# Patient Record
Sex: Male | Born: 1960 | Race: White | Hispanic: No | Marital: Married | Smoking: Former smoker
Health system: Southern US, Community
[De-identification: ages and names within clinical notes are randomized; demographics above are authoritative.]

## PROBLEM LIST (undated history)

## (undated) DIAGNOSIS — E119 Type 2 diabetes mellitus without complications: Secondary | ICD-10-CM

## (undated) DIAGNOSIS — I219 Acute myocardial infarction, unspecified: Secondary | ICD-10-CM

## (undated) DIAGNOSIS — I10 Essential (primary) hypertension: Secondary | ICD-10-CM

## (undated) DIAGNOSIS — E7800 Pure hypercholesterolemia, unspecified: Secondary | ICD-10-CM

## (undated) HISTORY — PX: HERNIA REPAIR: SHX51

---

## 2017-01-08 ENCOUNTER — Encounter: Payer: Self-pay | Admitting: *Deleted

## 2017-01-08 ENCOUNTER — Emergency Department (INDEPENDENT_AMBULATORY_CARE_PROVIDER_SITE_OTHER): Payer: Self-pay

## 2017-01-08 ENCOUNTER — Emergency Department
Admission: EM | Admit: 2017-01-08 | Discharge: 2017-01-08 | Disposition: A | Discharge status: Home / Self Care | Payer: Self-pay | Source: Home / Self Care | Attending: Family Medicine | Admitting: Family Medicine

## 2017-01-08 DIAGNOSIS — J209 Acute bronchitis, unspecified: Secondary | ICD-10-CM

## 2017-01-08 DIAGNOSIS — R05 Cough: Secondary | ICD-10-CM

## 2017-01-08 DIAGNOSIS — R062 Wheezing: Secondary | ICD-10-CM

## 2017-01-08 HISTORY — DX: Type 2 diabetes mellitus without complications: E11.9

## 2017-01-08 MED ORDER — AZITHROMYCIN 250 MG PO TABS: ORAL_TABLET | ORAL | 0 refills | Status: DC

## 2017-01-08 NOTE — ED Provider Notes (Signed)
[name]    CSN: [id number] Arrival date & time: 01/08/17  1437     History   Chief Complaint Chief Complaint  Patient presents with  . Cough    HPI Travis Roach is a 56 y.o. male.   About 2.5 weeks ago patient developed typical cold-like symptoms developing over several days, including mild sore throat, sinus congestion, headache, fatigue, and cough.  He now complains of persistent cough, without shortness of breath or pleuritic pain.  No fevers, chills, and sweats.   The history is provided by the patient.    Past Medical History:  Diagnosis Date  . Diabetes mellitus without complication (HCC)     There are no active problems to display for this patient.   Past Surgical History:  Procedure Laterality Date  . HERNIA REPAIR         Home Medications    Prior to Admission medications   Medication Sig Start Date End Date Taking? Authorizing Provider  glimepiride (AMARYL) 4 MG tablet Take 4 mg by mouth daily with breakfast.   Yes Historical Provider, MD  metFORMIN (GLUCOPHAGE) 1000 MG tablet Take 1,000 mg by mouth 2 (two) times daily with a meal.   Yes Historical Provider, MD  azithromycin (ZITHROMAX Z-PAK) 250 MG tablet Take 2 tabs today; then begin one tab once daily for 4 more days. 01/08/17   Lattie Haw, MD    Family History Family History  Problem Relation Age of Onset  . Cancer Mother     breast and stomach  . Cancer Father     colon and prostate    Social History Social History  Substance Use Topics  . Smoking status: Former Games developer  . Smokeless tobacco: Never Used  . Alcohol use No     Allergies   Prednisone   Review of Systems Review of Systems + sore throat + cough No pleuritic pain No wheezing + nasal congestion + post-nasal drainage No sinus pain/pressure No itchy/red eyes No earache No hemoptysis No SOB No fever/chills No nausea No vomiting No abdominal pain No diarrhea No urinary symptoms No skin  rash + fatigue No myalgias No headache Used OTC meds without relief   Physical Exam Triage Vital Signs ED Triage Vitals  Enc Vitals Group     BP 01/08/17 1459 101/66     Pulse Rate 01/08/17 1459 87     Resp 01/08/17 1459 18     Temp 01/08/17 1459 97.8 F (36.6 C)     Temp Source 01/08/17 1459 Oral     SpO2 01/08/17 1459 97 %     Weight 01/08/17 1500 210 lb (95.3 kg)     Height 01/08/17 1500  (1.753 m)     Head Circumference --      Peak Flow --      Pain Score 01/08/17 1500 0     Pain Loc --      Pain Edu? --      Excl. in GC? --    No data found.   Updated Vital Signs BP 101/66 (BP Location: Left Arm)   Pulse 87   Temp 97.8 F (36.6 C) (Oral)   Resp 18   Ht  (1.753 m)   Wt 210 lb (95.3 kg)   SpO2 97%   BMI 31.01 kg/m   Visual Acuity Right Eye Distance:   Left Eye Distance:   Bilateral Distance:    Right Eye Near:   Left Eye Near:  Bilateral Near:     Physical Exam Nursing notes and Vital Signs reviewed. Appearance:  Patient appears stated age, and in no acute distress Eyes:  Pupils are equal, round, and reactive to light and accomodation.  Extraocular movement is intact.  Conjunctivae are not inflamed  Ears:  Canals normal.  Tympanic membranes normal.  Nose:  Mildly congested turbinates.  No sinus tenderness.   Pharynx:  Normal Neck:  Supple.  Tender enlarged posterior/lateral nodes are palpated bilaterally  Lungs:  Clear to auscultation.  Breath sounds are equal.  Moving air well. Heart:  Regular rate and rhythm without murmurs, rubs, or gallops.  Abdomen:  Nontender without masses or hepatosplenomegaly.  Bowel sounds are present.  No CVA or flank tenderness.  Extremities:  No edema.  Skin:  No rash present.    UC Treatments / Results  Labs (all labs ordered are listed, but only abnormal results are displayed) Labs Reviewed - No data to display  EKG  EKG Interpretation None       Radiology Dg Chest 2 View  Result Date:  01/08/2017 CLINICAL DATA:  Chest congestion, cough, wheezing for 2 weeks EXAM: CHEST  2 VIEW COMPARISON:  None. FINDINGS: No pneumonia or effusion is seen. Mediastinal and hilar contours are unremarkable. There is peribronchial thickening which may indicate bronchitis. The heart is within normal limits in size. No bony abnormality is seen. IMPRESSION: No pneumonia or effusion.  Question bronchitis. Electronically Signed   By: Dwyane Dee M.D.   On: 01/08/2017 16:21    Procedures Procedures (including critical care time)  Medications Ordered in UC Medications - No data to display   Initial Impression / Assessment and Plan / UC Course  I have reviewed the triage vital signs and the nursing notes.  Pertinent labs & imaging results that were available during my care of the patient were reviewed by me and considered in my medical decision making (see chart for details).    Begin Z-pak for atypical coverage. Take plain guaifenesin (  extended release tabs such as Mucinex) twice daily, with plenty of water, for cough and congestion.  Get adequate rest.   May take Delsym Cough Suppressant at bedtime for nighttime cough.  Stop all antihistamines for now, and other non-prescription cough/cold preparations. Follow-up with family doctor if not improving about 7 to 10 days.    Final Clinical Impressions(s) / UC Diagnoses   Final diagnoses:  Acute bronchitis, unspecified organism    New Prescriptions New Prescriptions   AZITHROMYCIN (ZITHROMAX Z-PAK) 250 MG TABLET    Take 2 tabs today; then begin one tab once daily for 4 more days.     Lattie Haw, MD 01/13/17 [phone]

## 2017-01-08 NOTE — Discharge Instructions (Signed)
Take plain guaifenesin (1200mg extended release tabs such as Mucinex) twice daily, with plenty of water, for cough and congestion.  Get adequate rest.   °May take Delsym Cough Suppressant at bedtime for nighttime cough.  °Stop all antihistamines for now, and other non-prescription cough/cold preparations. ° °Follow-up with family doctor if not improving about 7 to10 days.  °

## 2017-01-08 NOTE — ED Triage Notes (Signed)
Pt c/o cough and chest congestion x 2 wks; the cough is mostly nonproductive. Denies fever. He has taken claritin and mucinex.

## 2017-05-07 ENCOUNTER — Encounter: Payer: Self-pay | Admitting: Osteopathic Medicine

## 2017-05-07 ENCOUNTER — Ambulatory Visit (INDEPENDENT_AMBULATORY_CARE_PROVIDER_SITE_OTHER): Payer: Self-pay | Admitting: Osteopathic Medicine

## 2017-05-07 VITALS — BP 145/90 | HR 82 | Ht 66.0 in | Wt 221.0 lb

## 2017-05-07 DIAGNOSIS — IMO0001 Reserved for inherently not codable concepts without codable children: Secondary | ICD-10-CM

## 2017-05-07 DIAGNOSIS — I10 Essential (primary) hypertension: Secondary | ICD-10-CM | POA: Insufficient documentation

## 2017-05-07 DIAGNOSIS — E1165 Type 2 diabetes mellitus with hyperglycemia: Secondary | ICD-10-CM

## 2017-05-07 LAB — POCT GLYCOSYLATED HEMOGLOBIN (HGB A1C): Hemoglobin A1C: 8.2

## 2017-05-07 MED ORDER — LOSARTAN POTASSIUM 25 MG PO TABS: 12.5000 mg | ORAL_TABLET | Freq: Every day | ORAL | 0 refills | Status: DC

## 2017-05-07 MED ORDER — GLIMEPIRIDE 4 MG PO TABS: 4.0000 mg | ORAL_TABLET | Freq: Two times a day (BID) | ORAL | 1 refills | Status: DC

## 2017-05-07 MED ORDER — METFORMIN HCL 1000 MG PO TABS: 1000.0000 mg | ORAL_TABLET | Freq: Two times a day (BID) | ORAL | 3 refills | Status: DC

## 2017-05-07 NOTE — Patient Instructions (Signed)
Blood pressure goal: 120/80 is considered normal, 130/80 or less is a good goal for you  Diabetes: Metformin twice per day every day Glimepiride as you've been taking it  Labs today  Recheck A1C in 3 months here in the office   Any problems or questions, please let me know!

## 2017-05-07 NOTE — Progress Notes (Signed)
HPI: Travis Roach is a 56 y.o. male  who [facility], 05/07/17,  for chief complaint of:  Chief Complaint  Patient presents with  . Establish Care    DM2: No follow-up in some time. Last A1C through [facility] was 7.2 in 2016. He has been taking medications "as needed" for high blood sugar. He is measuring fasting blood sugar most days, has been trying to target into the low 100s.  HTN:  BP high today. No meds. 131/90 in 04/2016. Has never been on medications for blood pressure. No chest pain, pressure, shortness of breath.   Past medical, surgical, social and family history reviewed: Patient Active Problem List   Diagnosis Date Noted  . Uncontrolled type 2 diabetes mellitus without complication, without long-term current use of insulin (HCC) 05/07/2017  . Essential hypertension 05/07/2017   Past Surgical History:  Procedure Laterality Date  . HERNIA REPAIR     Social History  Substance Use Topics  . Smoking status: Former Games developermoker  . Smokeless tobacco: Never Used  . Alcohol use No   Family History  Problem Relation Age of Onset  . Cancer Mother        breast and stomach  . Cancer Father        colon and prostate     Current medication list and allergy/intolerance information reviewed:  Out of meds, previous on Glimepiride 4 mg bid and Metformin 1000 mg bid   Allergies  Allergen Reactions  . Prednisone       Review of Systems:  Constitutional:  No  fever, no chills, No recent illness, No unintentional weight changes. No significant fatigue.   HEENT: No  headache, no vision change, no hearing change, No sore throat, No  sinus pressure  Cardiac: No  chest pain, No  pressure, No palpitations, No  Orthopnea  Respiratory:  No  shortness of breath. No  Cough  Gastrointestinal: No  abdominal pain, No  nausea, No  vomiting,  No  blood in stool, No  diarrhea, No  constipation   Musculoskeletal: No new  myalgia/arthralgia  Genitourinary: No  incontinence, No  abnormal genital bleeding, No abnormal genital discharge  Skin: No  Rash, No other wounds/concerning lesions  Hem/Onc: No  easy bruising/bleeding, No  abnormal lymph node  Endocrine: No cold intolerance,  No heat intolerance. No polyuria/polydipsia/polyphagia   Neurologic: No  weakness, No  dizziness, No  slurred speech/focal weakness/facial droop  Psychiatric: No  concerns with depression, No  concerns with anxiety, No sleep problems, No mood problems  Exam:  BP (!) 145/90   Pulse 82   Ht 5\' 6"  (1.676 m)   Wt 221 lb (100.2 kg)   BMI 35.67 kg/m  on initial intake was 183/102  Constitutional: VS see above. General Appearance: alert, well-developed, well-nourished, NAD  Eyes: Normal lids and conjunctive, non-icteric sclera  Ears, Nose, Mouth, Throat: MMM, Normal external inspection ears/nares/mouth/lips/gums.  Neck: No masses, trachea midline  Respiratory: Normal respiratory effort. no wheeze, no rhonchi, no rales  Cardiovascular: S1/S2 normal, no murmur, no rub/gallop auscultated. RRR. No lower extremity edema.  Musculoskeletal: Gait normal.   Neurological: Normal balance/coordination.   Psychiatric: Normal judgment/insight. Normal mood and affect. Oriented x3.    Results for orders placed or performed in visit on 05/07/17 (from the past 72 hour(s))  POCT HgB A1C     Status: Abnormal   Collection Time: 05/07/17  2:35 PM  Result Value Ref Range   Hemoglobin  A1C 8.2      ASSESSMENT/PLAN:   Uncontrolled type 2 diabetes mellitus without complication, without long-term current use of insulin (HCC) - Plan: POCT HgB A1C, metFORMIN (GLUCOPHAGE) 1000 MG tablet, COMPLETE METABOLIC PANEL WITH GFR, Lipid panel, glimepiride (AMARYL) 4 MG tablet  Essential hypertension - Plan: losartan (COZAAR) 25 MG tablet    Patient Instructions  Blood pressure goal: 120/80 is considered normal, 130/80 or less is a good goal for  you  Diabetes: Metformin twice per day every day Glimepiride as you've been taking it  Labs today  Recheck A1C in 3 months here in the office   Any problems or questions, please let me know!      Visit summary with medication list and pertinent instructions was printed for patient to review. All questions at time of visit were answered - patient instructed to contact office with any additional concerns. ER/RTC precautions were reviewed with the patient. Follow-up plan: Return in about 3 months (around 08/07/2017) for recheck blood sugars .  Note: Total time spent 30 minutes, greater than 50% of the visit was spent face-to-face counseling and coordinating care for the following: The primary encounter diagnosis was Uncontrolled type 2 diabetes mellitus without complication, without long-term current use of insulin (HCC). A diagnosis of Essential hypertension was also pertinent to this visit..[name] Kitchen

## 2017-05-08 LAB — LIPID PANEL
Cholesterol: 175 mg/dL (ref <200)
HDL: 51 mg/dL (ref >40)
LDL CALC: 89 mg/dL (ref <100)
Total CHOL/HDL Ratio: 3.4 Ratio (ref <5.0)
Triglycerides: 177 mg/dL — ABNORMAL HIGH (ref <150)
VLDL: 35 mg/dL — ABNORMAL HIGH (ref <30)

## 2017-05-08 LAB — COMPLETE METABOLIC PANEL WITH GFR
ALBUMIN: 4.2 g/dL (ref 3.6–5.1)
ALK PHOS: 65 U/L (ref 40–115)
ALT: 17 U/L (ref 9–46)
AST: 14 U/L (ref 10–35)
BILIRUBIN TOTAL: 0.6 mg/dL (ref 0.2–1.2)
BUN: 15 mg/dL (ref 7–25)
CO2: 22 mmol/L (ref 20–31)
Calcium: 9.1 mg/dL (ref 8.6–10.3)
Chloride: 103 mmol/L (ref 98–110)
Creat: 0.93 mg/dL (ref 0.70–1.33)
GFR, Est African American: >89 mL/min (ref >=60)
GFR, Est Non African American: >89 mL/min (ref >=60)
GLUCOSE: 178 mg/dL — AB (ref 65–99)
Potassium: 4.2 mmol/L (ref 3.5–5.3)
SODIUM: 137 mmol/L (ref 135–146)
TOTAL PROTEIN: 6.4 g/dL (ref 6.1–8.1)

## 2017-08-07 ENCOUNTER — Ambulatory Visit (INDEPENDENT_AMBULATORY_CARE_PROVIDER_SITE_OTHER): Payer: Self-pay | Admitting: Osteopathic Medicine

## 2017-08-07 ENCOUNTER — Encounter: Payer: Self-pay | Admitting: Osteopathic Medicine

## 2017-08-07 VITALS — BP 117/74 | HR 93 | Ht 66.0 in | Wt 210.0 lb

## 2017-08-07 DIAGNOSIS — E1165 Type 2 diabetes mellitus with hyperglycemia: Secondary | ICD-10-CM

## 2017-08-07 DIAGNOSIS — IMO0001 Reserved for inherently not codable concepts without codable children: Secondary | ICD-10-CM

## 2017-08-07 LAB — POCT GLYCOSYLATED HEMOGLOBIN (HGB A1C): HEMOGLOBIN A1C: 8.8

## 2017-08-07 NOTE — Patient Instructions (Signed)
Plan: Continue current medications - to twice per day  Hold off for now, but think about statin medication for heart protection Labs before next visit in 3-4 months

## 2017-08-07 NOTE — Progress Notes (Signed)
HPI: Travis Roach is a 56 y.o. male  who [facility] today, 08/07/17,  for chief complaint of:  Chief Complaint  Patient presents with  . Diabetes    DM2: Last A1C through [facility] was 7.2 in 2016. He had been taking medications "as needed" for high blood sugar, prior to establishing here 3 months ago, at which point A1C was 8.2. He is measuring fasting blood sugar most days, has been trying to target into the low 100s. At last visit, 05/07/17, restarted metformin and glimepiride. A1C now 8.8 today. He is not taking the meds bid   HTN:  BP high last visit 145/90 on no meds. 131/90 in 04/2016. Has never been on medications for blood pressure. No chest pain, pressure, shortness of breath. We started Losartan last visit but he isn't taking this. BP looking ok today.    Past medical, surgical, social and family history reviewed: Patient Active Problem List   Diagnosis Date Noted  . Uncontrolled type 2 diabetes mellitus without complication, without long-term current use of insulin (HCC) 05/07/2017  . Essential hypertension 05/07/2017   Past Surgical History:  Procedure Laterality Date  . HERNIA REPAIR     Social History  Substance Use Topics  . Smoking status: Former Games developermoker  . Smokeless tobacco: Never Used  . Alcohol use No   Family History  Problem Relation Age of Onset  . Cancer Mother        breast and stomach  . Cancer Father        colon and prostate     Current medication list and allergy/intolerance information reviewed:  Out of meds, previous on Glimepiride 4 mg bid and Metformin 1000 mg bid   Allergies  Allergen Reactions  . Prednisone       Review of Systems:  Constitutional:  No  fever, no chills, No recent illness  Cardiac: No  chest pain, No  pressure, No palpitations  Respiratory:  No  shortness of breath. No  Cough  Gastrointestinal: No  abdominal pain, No  nausea, No  vomiting  Musculoskeletal: No  new myalgia/arthralgia   Exam:  BP 117/74   Pulse 93   Ht 5\' 6"  (1.676 m)   Wt 210 lb (95.3 kg)   BMI 33.89 kg/m    Constitutional: VS see above. General Appearance: alert, well-developed, well-nourished, NAD  Eyes: Normal lids and conjunctive, non-icteric sclera  Ears, Nose, Mouth, Throat: MMM, Normal external inspection ears/nares/mouth/lips/gums.  Neck: No masses, trachea midline  Respiratory: Normal respiratory effort. no wheeze, no rhonchi, no rales  Cardiovascular: S1/S2 normal, no murmur, no rub/gallop auscultated. RRR. No lower extremity edema.  Musculoskeletal: Gait normal.   Neurological: Normal balance/coordination.   Psychiatric: Normal judgment/insight. Normal mood and affect. Oriented x3.    Results for orders placed or performed in visit on 08/07/17 (from the past 72 hour(s))  POCT HgB A1C     Status: None   Collection Time: 08/07/17  2:04 PM  Result Value Ref Range   Hemoglobin A1C 8.8      ASSESSMENT/PLAN:   Uncontrolled type 2 diabetes mellitus without complication, without long-term current use of insulin (HCC) - Plan: POCT HgB A1C, Lipid panel, COMPLETE METABOLIC PANEL WITH GFR    Patient Instructions  Plan: Continue current medications - to twice per day  Hold off for now, but think about statin medication for heart protection Labs before next visit in 3-4 months   Consider statin and ACE - pt  reluctant to start new meds   Visit summary with medication list and pertinent instructions was printed for patient to review. All questions at time of visit were answered - patient instructed to contact office with any additional concerns. ER/RTC precautions were reviewed with the patient. Follow-up plan: Return in about 4 months (around 12/05/2017) for follow up diabetes and cholesterol, sooner if needed .  Note: Total time spent 30 minutes, greater than 50% of the visit was spent face-to-face counseling and coordinating care for the following: The  encounter diagnosis was Uncontrolled type 2 diabetes mellitus without complication, without long-term current use of insulin (HCC).[name] Kitchen

## 2017-12-08 ENCOUNTER — Ambulatory Visit: Payer: Self-pay | Admitting: Osteopathic Medicine

## 2018-06-01 ENCOUNTER — Encounter: Payer: Self-pay | Admitting: Family Medicine

## 2018-06-01 ENCOUNTER — Ambulatory Visit (INDEPENDENT_AMBULATORY_CARE_PROVIDER_SITE_OTHER): Payer: Self-pay | Admitting: Family Medicine

## 2018-06-01 VITALS — BP 139/75 | HR 79 | Ht 69.0 in | Wt 214.0 lb

## 2018-06-01 DIAGNOSIS — S93491A Sprain of other ligament of right ankle, initial encounter: Secondary | ICD-10-CM

## 2018-06-01 NOTE — Patient Instructions (Addendum)
Thank you for coming in today. Continue the brace and ice and rest as needed.   Do the exercixes listed below.   Recheck as needed.   Recheck with Dr Lyn Hollingshead for follow up diabetes.    Ankle Sprain, Phase I Rehab Ask your health care provider which exercises are safe for you. Do exercises exactly as told by your health care provider and adjust them as directed. It is normal to feel mild stretching, pulling, tightness, or discomfort as you do these exercises, but you should stop right away if you feel sudden pain or your pain gets worse.Do not begin these exercises until told by your health care provider. Stretching and range of motion exercises These exercises warm up your muscles and joints and improve the movement and flexibility of your lower leg and ankle. These exercises also help to relieve pain and stiffness. Exercise A: Gastroc and soleus stretch  1. Sit on the floor with your left / right leg extended. 2. Loop a belt or towel around the ball of your left / right foot. The ball of your foot is on the walking surface, right under your toes. 3. Keep your left / right ankle and foot relaxed and keep your knee straight while you use the belt or towel to pull your foot toward you. You should feel a gentle stretch behind your calf or knee. 4. Hold this position for __________ seconds, then release to the starting position. Repeat the exercise with your knee bent. You can put a pillow or a rolled bath towel under your knee to support it. You should feel a stretch deep in your calf or at your Achilles tendon. Repeat each stretch __________ times. Complete these stretches __________ times a day. Exercise B: Ankle alphabet  1. Sit with your left / right leg supported at the lower leg. ? Do not rest your foot on anything. ? Make sure your foot has room to move freely. 2. Think of your left / right foot as a paintbrush, and move your foot to trace each letter of the alphabet in the air.  Keep your hip and knee still while you trace. Make the letters as large as you can without feeling discomfort. 3. Trace every letter from A to Z. Repeat __________ times. Complete this exercise __________ times a day. Strengthening exercises These exercises build strength and endurance in your ankle and lower leg. Endurance is the ability to use your muscles for a long time, even after they get tired. Exercise C: Dorsiflexors  1. Secure a rubber exercise band or tube to an object, such as a table leg, that will stay still when the band is pulled. Secure the other end around your left / right foot. 2. Sit on the floor facing the object, with your left / right leg extended. The band or tube should be slightly tense when your foot is relaxed. 3. Slowly bring your foot toward you, pulling the band tighter. 4. Hold this position for __________ seconds. 5. Slowly return your foot to the starting position. Repeat __________ times. Complete this exercise __________ times a day. Exercise D: Plantar flexors  1. Sit on the floor with your left / right leg extended. 2. Loop a rubber exercise tube or band around the ball of your left / right foot. The ball of your foot is on the walking surface, right under your toes. ? Hold the ends of the band or tube in your hands. ? The band or tube should be  slightly tense when your foot is relaxed. 3. Slowly point your foot and toes downward, pushing them away from you. 4. Hold this position for __________ seconds. 5. Slowly return your foot to the starting position. Repeat __________ times. Complete this exercise __________ times a day. Exercise E: Evertors 1. Sit on the floor with your legs straight out in front of you. 2. Loop a rubber exercise band or tube around the ball of your left / right foot. The ball of your foot is on the walking surface, right under your toes. ? Hold the ends of the band in your hands, or secure the band to a stable object. ? The  band or tube should be slightly tense when your foot is relaxed. 3. Slowly push your foot outward, away from your other leg. 4. Hold this position for __________ seconds. 5. Slowly return your foot to the starting position. Repeat __________ times. Complete this exercise __________ times a day. This information is not intended to replace advice given to you by your health care provider. Make sure you discuss any questions you have with your health care provider. Document Released: 04/24/2005 Document Revised: 05/30/2016 Document Reviewed: 08/07/2015 Elsevier Interactive Patient Education  2018 [employer].    Ankle Sprain, Phase II Rehab Ask your health care provider which exercises are safe for you. Do exercises exactly as told by your health care provider and adjust them as directed. It is normal to feel mild stretching, pulling, tightness, or discomfort as you do these exercises, but you should stop right away if you feel sudden pain or your pain gets worse.Do not begin these exercises until told by your health care provider. Stretching and range of motion exercises These exercises warm up your muscles and joints and improve the movement and flexibility of your lower leg and ankle. These exercises also help to relieve pain and stiffness. Exercise A: Gastroc stretch, standing  1. Stand with your hands against a wall. 2. Extend your left / right leg behind you, and bend your front knee slightly. Your heels should be on the floor. 3. Keeping your heels on the floor and your back knee straight, shift your weight toward the wall. You should feel a gentle stretch in the back of your lower leg (calf). 4. Hold this position for __________ seconds. Repeat __________ times. Complete this exercise __________ times a day. Exercise B: Soleus stretch, standing 1. Stand with your hands against a wall. 2. Extend your left / right leg behind you, and bend your front knee slightly. Both of your heels  should be on the floor. 3. Keeping your heels on the floor, bend your back knee and shift your weight slightly over your back leg. You should feel a gentle stretch deep in your calf. 4. Hold this position for __________ seconds. Repeat __________ times. Complete this exercise __________ times a day. Strengthening exercises These exercises build strength and endurance in your lower leg. Endurance is the ability to use your muscles for a long time, even after they get tired. Exercise C: Heel walking ( dorsiflexion) Walk on your heels for __________ seconds or ___________ ft. Keep your toes as high as possible. Repeat __________ times. Complete this exercise __________ times a day. Balance exercises These exercises improve your balance and the reaction and control of your ankle to help improve stability. Exercise D: Multi-angle lunge 1. Stand with your feet together. 2. Take a step forward with your left / right leg, and shift your weight onto that  leg. Your back heel will come off the floor, and your back toes will stay in place. 3. Push off your front leg to return your front foot to the starting position next to your other foot. 4. Repeat to the side, to the back, and any other directions as told by your health care provider. Repeat in each direction __________ times. Complete this exercise __________ times a day. Exercise E: Single leg stand 1. Without shoes, stand near a railing or in a door frame. Hold onto the railing or door frame as needed. 2. Stand on your left / right foot. Keep your big toe down on the floor and try to keep your arch lifted. 3. Hold this position for __________ seconds. Repeat __________ times. Complete this exercise __________ times a day. If this exercise is too easy, you can try it with your eyes closed or while standing on a pillow. Exercise F: Inversion/eversion  You will need a balance board for this exercise. Ask your health care provider where you can get a  balance board or how you can make one. 1. Stand on a non-carpeted surface near a countertop or wall. 2. Step onto the balance board so your feet are hip-width apart. 3. Keep your feet in place and keep your upper body and hips steady. Using only your feet and ankles to move the board, do one or both of the following exercises as told by your health care provider: ? Tip the board side to side as far as you can, alternating between tipping to the left and tipping to the right. If you can, tip the board so it silently taps the floor. Do not let the board forcefully hit the floor. From time to time, pause to hold a steady position. ? Tip the board side to side so the board does not hit the floor at all. From time to time, pause to hold a steady position. Repeat the movement for each exercise __________ times. Complete each exercise __________ times a day. Exercise G: Plantar flexion/dorsiflexion  You will need a balance board for this exercise. Ask your health care provider where you can get a balance board or how you can make one. 1. Stand on a non-carpeted surface near a countertop or wall. 2. Step onto the balance board so your feet are hip-width apart. 3. Keep your feet in place and keep your upper body and hips steady. Using only your feet and ankles to move the board, do one or both of the following exercises as told by your health care provider: ? Tip the board forward and backward so the board silently taps the floor. Do not let the board forcefully hit the floor. From time to time, pause to hold a steady position. ? Tip the board forward and backward so the board does not hit the floor at all. From time to time, pause to hold a steady position. Repeat the movement for each exercise __________ times. Complete each exercise __________ times a day. This information is not intended to replace advice given to you by your health care provider. Make sure you discuss any questions you have with your  health care provider. Document Released: 01/13/2006 Document Revised: 05/30/2016 Document Reviewed: 08/07/2015 Elsevier Interactive Patient Education  2018 [employer]

## 2018-06-01 NOTE — Progress Notes (Signed)
   Travis BoozerVernon Roach is a 57 y.o. male who presents to [facility] today for right ankle sprain.  Travis KansasVernon was in his normal state of health on Wednesday August 21.  He inverted his ankle on a tree root.  He developed pain and swelling.  In the interim he has been treating with ice rest and ankle brace and home exercise program.  He is feeling a lot better.  He is here today because he told his employer that he sprained his ankle and they require a medical letter stating that he is cleared to return to work without restrictions.  He feels well and is able to walk without limping.  Additionally he notes that he has an appointment in November scheduled to follow-up with his primary care physician to reestablish care for his diabetes and other health problems.    ROS:  As above  Exam:  BP 139/75   Pulse 79   Ht 5\' 9"  (1.753 m)   Wt 214 lb (97.1 kg)   BMI 31.60 kg/m  General: Well Developed, well nourished, and in no acute distress.  Neuro/Psych: Alert and oriented x3, extra-ocular muscles intact, able to move all 4 extremities, sensation grossly intact. Skin: Warm and dry, no rashes noted.  Respiratory: Not using accessory muscles, speaking in full sentences, trachea midline.  Cardiovascular: Pulses palpable, no extremity edema. Abdomen: Does not appear distended. MSK:  Right ankle: Normal-appearing without any significant ecchymosis or swelling. Knee ankle and foot are nontender. Normal ankle and foot motion. Stable ligamentous exam. Intact strength. Pulses capillary fill and sensation are intact distally.      Assessment and Plan: 57 y.o. male with ankle sprain.  Doing quite well.  [place] negative.  Plan for home exercise program and simple conservative management strategy.  Okay to return to work tomorrow without restrictions.  Precautions reviewed.  Recheck with me as needed.  Follow-up with PCP in the near future to reestablish care  for diabetes.    Historical information moved to improve visibility of documentation.  Past Medical History:  Diagnosis Date  . Diabetes mellitus without complication [facility](HCC)    Past Surgical History:  Procedure Laterality Date  . HERNIA REPAIR     Social History   Tobacco Use  . Smoking status: Former Games developermoker  . Smokeless tobacco: Never Used  Substance Use Topics  . Alcohol use: No   family history includes Cancer in his father and mother.  Medications: Current Outpatient Medications  Medication Sig Dispense Refill  . glimepiride (AMARYL) 4 MG tablet Take 1 tablet (4 mg total) by mouth 2 (two) times daily. 180 tablet 1  . metFORMIN (GLUCOPHAGE) 1000 MG tablet Take 1 tablet (1,000 mg total) by mouth 2 (two) times daily with a meal. 180 tablet 3   No current facility-administered medications for this visit.    Allergies  Allergen Reactions  . Prednisone       Discussed warning signs or symptoms. Please see discharge instructions. Patient expresses understanding.

## 2018-08-11 ENCOUNTER — Ambulatory Visit: Payer: Self-pay | Admitting: Osteopathic Medicine

## 2019-12-07 ENCOUNTER — Telehealth: Payer: Self-pay | Admitting: Osteopathic Medicine

## 2019-12-07 DIAGNOSIS — Z0000 Encounter for general adult medical examination without abnormal findings: Secondary | ICD-10-CM

## 2019-12-07 DIAGNOSIS — I10 Essential (primary) hypertension: Secondary | ICD-10-CM

## 2019-12-07 DIAGNOSIS — R739 Hyperglycemia, unspecified: Secondary | ICD-10-CM

## 2019-12-07 NOTE — Telephone Encounter (Signed)
Looks good, I signed it! Thanks!

## 2019-12-07 NOTE — Telephone Encounter (Signed)
Anything additional?

## 2019-12-07 NOTE — Telephone Encounter (Signed)
Travis Roach called today to inquire about getting some labs done and scheduling med follow up. [name] stated that he would like to get his labs done before coming in. He also stated that it has been awhile since he has been in the office. Pharmacy on file and cell number are correct. He is still self pay as well.

## 2019-12-07 NOTE — Telephone Encounter (Signed)
Pt has been updated of lab order. As per pt, he will complete fasting labs tomorrow. He will contact the office to make an appt to review lab results.

## 2019-12-08 ENCOUNTER — Other Ambulatory Visit: Payer: Self-pay

## 2019-12-08 DIAGNOSIS — E1121 Type 2 diabetes mellitus with diabetic nephropathy: Secondary | ICD-10-CM

## 2019-12-08 DIAGNOSIS — Z794 Long term (current) use of insulin: Secondary | ICD-10-CM

## 2019-12-08 MED ORDER — METFORMIN HCL 1000 MG PO TABS: 1000.0000 mg | ORAL_TABLET | Freq: Two times a day (BID) | ORAL | 0 refills | Status: DC

## 2019-12-08 MED ORDER — GLIMEPIRIDE 4 MG PO TABS: 4.0000 mg | ORAL_TABLET | Freq: Two times a day (BID) | ORAL | 0 refills | Status: DC

## 2019-12-08 NOTE — Progress Notes (Unsigned)
Pt called requesting refills for diabetes rxs. As per pt, he is unable to complete lab work. He has been currently out of work and is unable to pay the amount due to have labs completed. 3 mths supply of medications sent to local pharmacy. Pt will tried to get some finances together to have labs drawn. No other inquiries.

## 2019-12-09 NOTE — Progress Notes (Signed)
I do understand and sympathize but we need labs once per year to ensure medication safety and effectiveness at controlling his conditions. Please make sure he has resources to get set up for [employer]

## 2019-12-10 NOTE — Progress Notes (Signed)
Pt has been updated and aware of provider's note and recommendation. Aware that stamped self pay requisition with [facility] charity program assistance available for pick up at front desk. Pt informed if cost of labs is still out of goal, for him to stop by the clinic to have labs drawn on site. Pt agreeable with plan. No other inquiries during call.

## 2019-12-31 LAB — COMPLETE METABOLIC PANEL WITH GFR
AG Ratio: 2 (calc) (ref 1.0–2.5)
ALT: 18 U/L (ref 9–46)
AST: 13 U/L (ref 10–35)
Albumin: 4.2 g/dL (ref 3.6–5.1)
Alkaline phosphatase (APISO): 69 U/L (ref 35–144)
BUN/Creatinine Ratio: 33 (calc) — ABNORMAL HIGH (ref 6–22)
BUN: 30 mg/dL — ABNORMAL HIGH (ref 7–25)
CO2: 25 mmol/L (ref 20–32)
Calcium: 9.9 mg/dL (ref 8.6–10.3)
Chloride: 106 mmol/L (ref 98–110)
Creat: 0.9 mg/dL (ref 0.70–1.33)
GFR, Est African American: 109 mL/min/{1.73_m2} (ref >=60)
GFR, Est Non African American: 94 mL/min/{1.73_m2} (ref >=60)
Globulin: 2.1 g/dL (calc) (ref 1.9–3.7)
Glucose, Bld: 151 mg/dL — ABNORMAL HIGH (ref 65–99)
Potassium: 4.5 mmol/L (ref 3.5–5.3)
Sodium: 139 mmol/L (ref 135–146)
Total Bilirubin: 0.3 mg/dL (ref 0.2–1.2)
Total Protein: 6.3 g/dL (ref 6.1–8.1)

## 2019-12-31 LAB — LIPID PANEL W/REFLEX DIRECT LDL
Cholesterol: 120 mg/dL (ref <200)
HDL: 43 mg/dL (ref >=40)
LDL Cholesterol (Calc): 50 mg/dL (calc)
Non-HDL Cholesterol (Calc): 77 mg/dL (calc) (ref <130)
Total CHOL/HDL Ratio: 2.8 (calc) (ref <5.0)
Triglycerides: 209 mg/dL — ABNORMAL HIGH (ref <150)

## 2019-12-31 LAB — HEMOGLOBIN A1C
Hgb A1c MFr Bld: 6.9 % of total Hgb — ABNORMAL HIGH (ref <5.7)
Mean Plasma Glucose: 151 (calc)
eAG (mmol/L): 8.4 (calc)

## 2020-01-10 ENCOUNTER — Other Ambulatory Visit: Payer: Self-pay

## 2020-01-10 ENCOUNTER — Encounter: Payer: Self-pay | Admitting: Osteopathic Medicine

## 2020-01-10 ENCOUNTER — Ambulatory Visit (INDEPENDENT_AMBULATORY_CARE_PROVIDER_SITE_OTHER): Payer: Self-pay | Admitting: Osteopathic Medicine

## 2020-01-10 VITALS — BP 132/82 | HR 84 | Temp 98.1°F | Ht 69.0 in | Wt 200.7 lb

## 2020-01-10 DIAGNOSIS — I2583 Coronary atherosclerosis due to lipid rich plaque: Secondary | ICD-10-CM

## 2020-01-10 DIAGNOSIS — I2510 Atherosclerotic heart disease of native coronary artery without angina pectoris: Secondary | ICD-10-CM

## 2020-01-10 DIAGNOSIS — E1121 Type 2 diabetes mellitus with diabetic nephropathy: Secondary | ICD-10-CM

## 2020-01-10 DIAGNOSIS — Z955 Presence of coronary angioplasty implant and graft: Secondary | ICD-10-CM

## 2020-01-10 DIAGNOSIS — I10 Essential (primary) hypertension: Secondary | ICD-10-CM

## 2020-01-10 MED ORDER — GLIMEPIRIDE 4 MG PO TABS: 4.0000 mg | ORAL_TABLET | Freq: Two times a day (BID) | ORAL | 3 refills | Status: DC

## 2020-01-10 MED ORDER — METFORMIN HCL 1000 MG PO TABS: 1000.0000 mg | ORAL_TABLET | Freq: Two times a day (BID) | ORAL | 3 refills | Status: DC

## 2020-01-10 NOTE — Progress Notes (Signed)
Travis Roach is a 59 y.o. male who presents to  [facility]  today, 01/10/20, seeking care for the following: . Follow-up hospitalization for STEMI 10/2019 . Follow-up new & chronic conditions: DM2, HTN, ischemic cardiomyopathy      ASSESSMENT & PLAN with other pertinent history/findings:  The primary encounter diagnosis was Controlled type 2 diabetes mellitus with diabetic nephropathy, without long-term current use of insulin (HCC). Diagnoses of Coronary artery disease due to lipid rich plaque, S/P primary angioplasty with coronary stent, and Essential hypertension were also pertinent to this visit.  Doing well after hospitalization and STEMI Dx Following w/ cardiology Reviewed hospital discharge summary - meds as beow  Meds:  atorvastatin (LIPITOR) 80 mg daily metoprolol tartrate (LOPRESSOR) 25 mg tablet Take one half tablet (12.5 mg dose) by mouth 2 (two) times daily. nitroGLYCERIN (NITROSTAT) 0.4 mg SL tablet Place one tablet (0.4 mg dose) under the tongue every 5 (five) minutes as needed for Chest pain. ticagrelor (BRILINTA) 90 mg tablet Take one tablet (90 mg dose) by mouth 2 (two) times daily.  CHANGED medications  aspirin (ASPRI-LOW,ECOTRIN LOW DOSE) 81 mg EC tablet Take one tablet (81 mg dose) by mouth daily.  CONTINUED medications  glimepiride (AMARYL) 4 mg tablet bid metformin (GLUCOPHAGE) 1000 MG tablet bid dapagliflozin (FARXIGA) 10 mg tablet daily - not taking     Labs:  12/14/2019 LDL 43 HDL 46 TSH WNL  11/08/2019 A1C 8.0 Cr 0.64     Meds ordered this encounter  Medications  . metFORMIN (GLUCOPHAGE) 1000 MG tablet    Sig: Take 1 tablet (1,000 mg total) by mouth 2 (two) times daily with a meal.    Dispense:  180 tablet    Refill:  3  . glimepiride (AMARYL) 4 MG tablet    Sig: Take 1 tablet (4 mg total) by mouth 2 (two) times daily.    Dispense:  180 tablet    Refill:  3        Follow-up instructions: Return in about 4 months (around 05/11/2020) for IN-OFFICE VISIT CHECK A1C .                                         BP 132/82   Pulse 84   Temp 98.1 F (36.7 C) (Oral)   Ht 5\' 9"  (1.753 m)   Wt 200 lb 11.2 oz (91 kg)   SpO2 99%   BMI 29.64 kg/m   Current Meds  Medication Sig  . aspirin 81 MG chewable tablet Chew by mouth daily.  atorvastatin (LIPITOR) 80 MG tablet Take 80 mg by mouth daily.  [name] Kitchen lisinopril (ZESTRIL) 2.5 MG tablet Take 2.5 mg by mouth daily.  . metoprolol succinate (TOPROL-XL) 25 MG 24 hr tablet Take 25 mg by mouth daily.  . ticagrelor (BRILINTA) 90 MG TABS tablet Take 90 mg by mouth 2 (two) times daily.    No results found for this or any previous visit (from the past 72 hour(s)).  No results found.  Depression screen [facility] 2/9 05/07/2017 05/07/2017  Decreased Interest 0 0  Down, Depressed, Hopeless 0 0  PHQ - 2 Score 0 0    No flowsheet data found.    All questions at time of visit were answered - patient instructed to contact office with any additional concerns or updates.  ER/RTC precautions were reviewed with  the patient.  Please note: voice recognition software was used to produce this document, and typos may escape review. Please contact Dr. Sheppard Coil for any needed clarifications.   Total encounter time: 30 minutes.

## 2020-05-11 ENCOUNTER — Ambulatory Visit: Payer: Self-pay | Admitting: Osteopathic Medicine

## 2020-06-24 ENCOUNTER — Other Ambulatory Visit: Payer: Self-pay | Admitting: Osteopathic Medicine

## 2020-06-24 DIAGNOSIS — E1121 Type 2 diabetes mellitus with diabetic nephropathy: Secondary | ICD-10-CM

## 2021-04-02 ENCOUNTER — Emergency Department
Admission: EM | Admit: 2021-04-02 | Discharge: 2021-04-02 | Disposition: A | Discharge status: Home / Self Care | Payer: Self-pay | Source: Home / Self Care | Attending: Family Medicine | Admitting: Family Medicine

## 2021-04-02 ENCOUNTER — Emergency Department (INDEPENDENT_AMBULATORY_CARE_PROVIDER_SITE_OTHER): Payer: Self-pay

## 2021-04-02 ENCOUNTER — Other Ambulatory Visit: Payer: Self-pay

## 2021-04-02 DIAGNOSIS — R0781 Pleurodynia: Secondary | ICD-10-CM

## 2021-04-02 DIAGNOSIS — S2232XA Fracture of one rib, left side, initial encounter for closed fracture: Secondary | ICD-10-CM

## 2021-04-02 HISTORY — DX: Pure hypercholesterolemia, unspecified: E78.00

## 2021-04-02 HISTORY — DX: Acute myocardial infarction, unspecified: I21.9

## 2021-04-02 HISTORY — DX: Essential (primary) hypertension: I10

## 2021-04-02 MED ORDER — HYDROCODONE-ACETAMINOPHEN 5-325 MG PO TABS: 1.0000 | ORAL_TABLET | Freq: Four times a day (QID) | ORAL | 0 refills | Status: DC | PRN

## 2021-04-02 NOTE — ED Triage Notes (Signed)
Pt presents to Urgent Care with c/o pain to L lower rib area/side after injury 2 weeks ago. Pt reports he got choked and "passed out for 5 seconds," falling and landing on his L side (against armrest of sofa).

## 2021-04-02 NOTE — Discharge Instructions (Addendum)
Take tylenol for moderate pain Take the hydrocodone if needed severe pain Do not take hydrocodone and drive or work Take deep breathe at least 3 x a day Watch for pneumonia Return for cough, fever, failure to improve

## 2021-04-03 ENCOUNTER — Telehealth: Payer: Self-pay | Admitting: Emergency Medicine

## 2021-04-03 NOTE — ED Provider Notes (Signed)
[facility]    CSN: [id number] Arrival date & time: 04/02/21  0803      History   Chief Complaint Chief Complaint  Patient presents with   Rib Injury    HPI Travis Roach is a 60 y.o. male.   HPI Patient states that he injured his ribs almost 2 weeks ago.  He states that he was choking on food and could not get a deep breath.  He states that he was coughing and gagging and making a lot of noise, was worried this would alarm his grandchildren.  When he stood up to walk into another room he fainted and fell across a love seat.  The arm of the seat hit him in his lower ribs as he collapsed towards his left side.  His left ribs have hurt ever since that time.  He has pain with a deep breath.  Slight shortness of breath.  No fever.  No cough.  No underlying lung disease.  He is not currently a smoker but does have a history of tobacco abuse Patient has well-controlled hypertension, hyperlipidemia, diabetes, and heart disease.  He is not having any angina type of chest pain, only left rib pain with deep, cough, sneeze  Past Medical History:  Diagnosis Date   Diabetes mellitus without complication (HCC)    Hypercholesteremia    Hypertension    MI (myocardial infarction) [facility])     Patient Active Problem List   Diagnosis Date Noted   Uncontrolled type 2 diabetes mellitus without complication, without long-term current use of insulin 05/07/2017   Essential hypertension 05/07/2017    Past Surgical History:  Procedure Laterality Date   HERNIA REPAIR         Home Medications    Prior to Admission medications   Medication Sig Start Date End Date Taking? Authorizing Provider  HYDROcodone-acetaminophen (NORCO/VICODIN) 5-325 MG tablet Take 1-2 tablets by mouth every 6 (six) hours as needed for severe pain. 04/02/21  Yes Travis Moore, MD  aspirin 81 MG chewable tablet Chew by mouth daily.    [provider]  atorvastatin (LIPITOR) 80 MG tablet Take 80 mg  by mouth daily.    [provider]  glimepiride (AMARYL) 4 MG tablet Take 1 tablet by mouth twice daily 06/26/20   Travis Nielsen, DO  lisinopril (ZESTRIL) 2.5 MG tablet Take 2.5 mg by mouth daily.    [provider]  metFORMIN (GLUCOPHAGE) 1000 MG tablet Take 1 tablet (1,000 mg total) by mouth 2 (two) times daily with a meal. 01/10/20   Travis Nielsen, DO  metoprolol succinate (TOPROL-XL) 25 MG 24 hr tablet Take 25 mg by mouth daily.    [provider]  ticagrelor (BRILINTA) 90 MG TABS tablet Take 90 mg by mouth 2 (two) times daily.    [provider]    Family History Family History  Problem Relation Age of Onset   Cancer Mother        breast and stomach   Cancer Father        colon and prostate    Social History Social History   Tobacco Use   Smoking status: Former    Pack years: 0.00   Smokeless tobacco: Never  Vaping Use   Vaping Use: Never used  Substance Use Topics   Alcohol use: No   Drug use: No     Allergies   Prednisone   Review of Systems Review of Systems See HPI  Physical Exam Triage Vital  Signs ED Triage Vitals  Enc Vitals Group     BP 04/02/21 0825 137/89     Pulse Rate 04/02/21 0825 93     Resp 04/02/21 0825 20     Temp 04/02/21 0825 98.4 F (36.9 C)     Temp Source 04/02/21 0825 Oral     SpO2 04/02/21 0825 99 %     Weight 04/02/21 0824 200 lb (90.7 kg)     Height 04/02/21 0824 5\' 8"  (1.727 m)     Head Circumference --      Peak Flow --      Pain Score 04/02/21 0824 8     Pain Loc --      Pain Edu? --      Excl. in GC? --    No data found.  Updated Vital Signs BP 137/89   Pulse 93   Temp 98.4 F (36.9 C) (Oral)   Resp 20   Ht 5\' 8"  (1.727 m)   Wt 90.7 kg   SpO2 99%   BMI 30.41 kg/m       Physical Exam Constitutional:      General: He is not in acute distress.    Appearance: He is well-developed.     Comments: Guarded movements, uncomfortable  HENT:     Head: Normocephalic and  atraumatic.  Eyes:     Conjunctiva/sclera: Conjunctivae normal.     Pupils: Pupils are equal, round, and reactive to light.  Cardiovascular:     Rate and Rhythm: Normal rate and regular rhythm.     Heart sounds: Normal heart sounds.  Pulmonary:     Effort: Pulmonary effort is normal. No respiratory distress.     Breath sounds: Normal breath sounds and air entry.    Chest:     Chest wall: Tenderness present.  Abdominal:     General: There is no distension.     Palpations: Abdomen is soft.  Musculoskeletal:        General: Normal range of motion.     Cervical back: Normal range of motion.  Skin:    General: Skin is warm and dry.  Neurological:     General: No focal deficit present.     Mental Status: He is alert.  Psychiatric:        Mood and Affect: Mood normal.        Behavior: Behavior normal.     UC Treatments / Results  Labs (all labs ordered are listed, but only abnormal results are displayed) Labs Reviewed - No data to display  EKG   Radiology DG Ribs Unilateral W/Chest Left  Result Date: 04/02/2021 CLINICAL DATA:  Pain following fall EXAM: LEFT RIBS AND CHEST - 3+ VIEW COMPARISON:  Chest radiograph January 08, 2017 FINDINGS: Frontal chest as well as oblique and cone-down rib images were obtained. There is slight left base atelectasis. Lungs elsewhere are clear. Heart size and pulmonary vascularity are normal. No adenopathy. There is an apparent coronary stent on the left. No appreciable pneumothorax or pleural effusion. There is a fracture of the anterior left seventh rib with angulation at the fracture site. No other fracture evident. IMPRESSION: Fracture anterior left seventh rib. No pneumothorax or pleural effusion. No edema or airspace opacity. Mild atelectasis left base. Heart size within normal limits. Electronically Signed   By: 04/04/2021 III M.D.   On: 04/02/2021 09:00    Procedures Procedures (including critical care time)  Medications Ordered in  UC Medications - No data to  display  Initial Impression / Assessment and Plan / UC Course  I have reviewed the triage vital signs and the nursing notes.  Pertinent labs & imaging results that were available during my care of the patient were reviewed by me and considered in my medical decision making (see chart for details).     Reviewed rib fracture.  Reviewed risk of pneumonia.  Discussed rib belt.  I gave patient a incentive spirometer and taught him how to use it.  I discussed with him the importance of maintaining full lung expansion.  Return for problems Final Clinical Impressions(s) / UC Diagnoses   Final diagnoses:  Closed fracture of one rib of left side, initial encounter     Discharge Instructions      Take tylenol for moderate pain Take the hydrocodone if needed severe pain Do not take hydrocodone and drive or work Take deep breathe at least 3 x a day Watch for pneumonia Return for cough, fever, failure to improve   ED Prescriptions     Medication Sig Dispense Auth. Provider   HYDROcodone-acetaminophen (NORCO/VICODIN) 5-325 MG tablet Take 1-2 tablets by mouth every 6 (six) hours as needed for severe pain. 10 tablet Travis Moore, MD      I have reviewed the PDMP during this encounter.   Travis Moore, MD 04/03/21 [phone]

## 2021-04-03 NOTE — Telephone Encounter (Signed)
Call from Vern regarding return to work note- pt was seen yesterday for rib pain. Note was given by Dr Delton See to return to work w/ light duty. 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 Pt to return on Sunday if he feels fit for duty for a return to work evaluation. Plan of care ok w/ Dr Delton See at this time

## 2021-04-08 ENCOUNTER — Telehealth: Payer: Self-pay | Admitting: Emergency Medicine

## 2021-04-08 ENCOUNTER — Other Ambulatory Visit: Payer: Self-pay

## 2021-04-08 ENCOUNTER — Emergency Department
Admission: EM | Admit: 2021-04-08 | Discharge: 2021-04-08 | Discharge status: Against Medical Advice | Payer: Self-pay | Source: Home / Self Care

## 2021-04-08 NOTE — Telephone Encounter (Signed)
Patient requesting RTW note; wants to return 04/10/2021; no symptoms suggesting this is not appropriate; consulted M.Ragan who authorized note.

## 2021-04-18 LAB — HM DIABETES EYE EXAM

## 2021-09-16 ENCOUNTER — Other Ambulatory Visit: Payer: Self-pay

## 2021-09-16 ENCOUNTER — Encounter: Payer: Self-pay | Admitting: Emergency Medicine

## 2021-09-16 ENCOUNTER — Emergency Department
Admission: EM | Admit: 2021-09-16 | Discharge: 2021-09-16 | Disposition: A | Discharge status: Home / Self Care | Payer: Self-pay | Source: Home / Self Care | Attending: Family Medicine | Admitting: Family Medicine

## 2021-09-16 DIAGNOSIS — J209 Acute bronchitis, unspecified: Secondary | ICD-10-CM

## 2021-09-16 MED ORDER — BENZONATATE 200 MG PO CAPS: 200.0000 mg | ORAL_CAPSULE | Freq: Three times a day (TID) | ORAL | 0 refills | Status: DC | PRN

## 2021-09-16 MED ORDER — SULFAMETHOXAZOLE-TRIMETHOPRIM 800-160 MG PO TABS: 1.0000 | ORAL_TABLET | Freq: Two times a day (BID) | ORAL | 0 refills | Status: AC

## 2021-09-16 MED ORDER — DM-GUAIFENESIN ER 30-600 MG PO TB12: 1.0000 | ORAL_TABLET | Freq: Two times a day (BID) | ORAL | 0 refills | Status: DC

## 2021-09-16 NOTE — ED Triage Notes (Signed)
Cough & congestion since Thanksgiving  Drinking lots of water  OTC mucinex  History of bronchitis  Bactrim has helped in past  No flu vaccine

## 2021-09-16 NOTE — Discharge Instructions (Signed)
Continue to drink lots of fluids Take the Bactrim 2 times a day as prescribed.  Take 2 doses today.  Make sure to take this with food Take Mucinex DM 2 times a day In addition take Tessalon 2-3 times a day See your doctor if not improving by the end of the week

## 2021-09-16 NOTE — ED Provider Notes (Signed)
[name]    CSN: [id number] Arrival date & time: 09/16/21  0806      History   Chief Complaint Chief Complaint  Patient presents with   Cough    HPI Travis Roach is a 60 y.o. male.   HPI  Patient states has had a cough off and on since Thanksgiving.  He states that his cough now is continuous.  He states his chest hurts from coughing.  He is coughing up scant amount of sputum.  States he is prone to bronchitis.  He used to be a smoker.  No known COPD.  Over-the-counter medicines are not helping.  Past Medical History:  Diagnosis Date   Diabetes mellitus without complication (HCC)    Hypercholesteremia    Hypertension    MI (myocardial infarction) [facility])     Patient Active Problem List   Diagnosis Date Noted   Uncontrolled type 2 diabetes mellitus without complication, without long-term current use of insulin 05/07/2017   Essential hypertension 05/07/2017    Past Surgical History:  Procedure Laterality Date   HERNIA REPAIR         Home Medications    Prior to Admission medications   Medication Sig Start Date End Date Taking? Authorizing Provider  benzonatate (TESSALON) 200 MG capsule Take 1 capsule (200 mg total) by mouth 3 (three) times daily as needed for cough. 09/16/21  Yes Eustace Moore, MD  dextromethorphan-guaiFENesin [facility] DM) 30-600 MG 12hr tablet Take 1 tablet by mouth 2 (two) times daily. 09/16/21  Yes Eustace Moore, MD  sulfamethoxazole-trimethoprim (BACTRIM DS) 800-160 MG tablet Take 1 tablet by mouth 2 (two) times daily for 10 days. 09/16/21 09/26/21 Yes Eustace Moore, MD  aspirin 81 MG chewable tablet Chew by mouth daily.    [provider]  atorvastatin (LIPITOR) 80 MG tablet Take 80 mg by mouth daily.    [provider]  glimepiride (AMARYL) 4 MG tablet Take 1 tablet by mouth twice daily 06/26/20   Sunnie Nielsen, DO  metFORMIN (GLUCOPHAGE) 1000 MG tablet Take 1 tablet (1,000 mg total) by  mouth 2 (two) times daily with a meal. 01/10/20   Sunnie Nielsen, DO  ticagrelor (BRILINTA) 90 MG TABS tablet Take 90 mg by mouth 2 (two) times daily.    [provider]    Family History Family History  Problem Relation Age of Onset   Cancer Mother        breast and stomach   Cancer Father        colon and prostate    Social History Social History   Tobacco Use   Smoking status: Former   Smokeless tobacco: Never  Building services engineer Use: Never used  Substance Use Topics   Alcohol use: No   Drug use: No     Allergies   Prednisone   Review of Systems Review of Systems See HPI  Physical Exam Triage Vital Signs ED Triage Vitals  Enc Vitals Group     BP 09/16/21 0815 123/77     Pulse Rate 09/16/21 0815 83     Resp 09/16/21 0815 18     Temp 09/16/21 0815 98.1 F (36.7 C)     Temp Source 09/16/21 0815 Oral     SpO2 09/16/21 0815 97 %     Weight 09/16/21 0818 194 lb (88 kg)     Height 09/16/21 0818 5\' 8"  (1.727 m)     Head Circumference --  Peak Flow --      Pain Score 09/16/21 0818 0     Pain Loc --      Pain Edu? --      Excl. in GC? --    No data found.  Updated Vital Signs BP 123/77 (BP Location: Right Arm)   Pulse 83   Temp 98.1 F (36.7 C) (Oral)   Resp 18   Ht 5\' 8"  (1.727 m)   Wt 88 kg   SpO2 97%   BMI 29.50 kg/m     Physical Exam Constitutional:      General: He is not in acute distress.    Appearance: He is well-developed. He is ill-appearing.  HENT:     Head: Normocephalic and atraumatic.     Right Ear: Tympanic membrane and ear canal normal.     Left Ear: Tympanic membrane and ear canal normal.     Nose: Congestion and rhinorrhea present.     Comments: Thick green nasal discharge    Mouth/Throat:     Mouth: Mucous membranes are moist.     Pharynx: No posterior oropharyngeal erythema.  Eyes:     Conjunctiva/sclera: Conjunctivae normal.     Pupils: Pupils are equal, round, and reactive to light.  Cardiovascular:      Rate and Rhythm: Normal rate and regular rhythm.     Heart sounds: Normal heart sounds.  Pulmonary:     Effort: Pulmonary effort is normal. No respiratory distress.     Breath sounds: Rhonchi present.  Abdominal:     General: There is no distension.     Palpations: Abdomen is soft.  Musculoskeletal:        General: Normal range of motion.     Cervical back: Normal range of motion and neck supple.  Lymphadenopathy:     Cervical: No cervical adenopathy.  Skin:    General: Skin is warm and dry.  Neurological:     Mental Status: He is alert.  Psychiatric:        Mood and Affect: Mood normal.        Behavior: Behavior normal.     UC Treatments / Results  Labs (all labs ordered are listed, but only abnormal results are displayed) Labs Reviewed - No data to display  EKG   Radiology No results found.  Procedures Procedures (including critical care time)  Medications Ordered in UC Medications - No data to display  Initial Impression / Assessment and Plan / UC Course  I have reviewed the triage vital signs and the nursing notes.  Pertinent labs & imaging results that were available during my care of the patient were reviewed by me and considered in my medical decision making (see chart for details).     Reviewed symptoms.  Treatment.  Likely started as a virus of the persistent cough.  We will give a trial of antibiotics as well as cough management.  Continue to push fluids Final Clinical Impressions(s) / UC Diagnoses   Final diagnoses:  None     Discharge Instructions      Continue to drink lots of fluids Take the Bactrim 2 times a day as prescribed.  Take 2 doses today.  Make sure to take this with food Take Mucinex DM 2 times a day In addition take Tessalon 2-3 times a day See your doctor if not improving by the end of the week   ED Prescriptions     Medication Sig Dispense Auth. Provider   benzonatate (TESSALON)  200 MG capsule Take 1 capsule (200 mg  total) by mouth 3 (three) times daily as needed for cough. 21 capsule Eustace Moore, MD   dextromethorphan-guaiFENesin [facility] DM) 30-600 MG 12hr tablet Take 1 tablet by mouth 2 (two) times daily. 20 tablet Eustace Moore, MD   sulfamethoxazole-trimethoprim (BACTRIM DS) 800-160 MG tablet Take 1 tablet by mouth 2 (two) times daily for 10 days. 20 tablet Eustace Moore, MD      PDMP not reviewed this encounter.   Eustace Moore, MD 09/16/21 [phone]

## 2021-10-20 ENCOUNTER — Other Ambulatory Visit: Payer: Self-pay

## 2021-10-20 ENCOUNTER — Emergency Department
Admission: EM | Admit: 2021-10-20 | Discharge: 2021-10-20 | Disposition: A | Discharge status: Home / Self Care | Payer: Self-pay | Source: Home / Self Care

## 2021-10-20 DIAGNOSIS — R197 Diarrhea, unspecified: Secondary | ICD-10-CM

## 2021-10-20 MED ORDER — DICYCLOMINE HCL 20 MG PO TABS: 20.0000 mg | ORAL_TABLET | Freq: Three times a day (TID) | ORAL | 0 refills | Status: DC | PRN

## 2021-10-20 NOTE — ED Triage Notes (Signed)
Pt present loose stool for over three weeks ago. Pt state he has this symptoms before but not  this long. Pt denies any other symptoms

## 2021-10-20 NOTE — Discharge Instructions (Addendum)
Strongly encouraged patient to follow-up with his PCP for overdue colonoscopy and baseline labs.  Advised patient given his Father's history first colonoscopy should have been at age 61.  Advised patient may use Bentyl daily or as needed for diarrhea.  Encouraged patient to adhere to bland diet gradually returning to normal diet.  Encouraged patient increase daily water intake while taking this medication.

## 2021-10-20 NOTE — ED Provider Notes (Signed)
[facility]    CSN: [id number] Arrival date & time: 10/20/21  0807      History   Chief Complaint Chief Complaint  Patient presents with   Diarrhea    HPI Travis Roach is a 61 y.o. male.   HPI 61 year old male presents with loose stools/diarrhea for over 3 weeks.  Reports having these symptoms prior to this but not this long.  PMH significant for HTN, MI, T2DM without complication.  Patient's most recent baseline labs were on 12/30/2019.  Patient reports that his father was diagnosed with colon cancer at age 61, patient has not had his first colonoscopy now at age 61.  Past Medical History:  Diagnosis Date   Diabetes mellitus without complication (HCC)    Hypercholesteremia    Hypertension    MI (myocardial infarction) [facility](HCC)     Patient Active Problem List   Diagnosis Date Noted   Uncontrolled type 2 diabetes mellitus without complication, without long-term current use of insulin 05/07/2017   Essential hypertension 05/07/2017    Past Surgical History:  Procedure Laterality Date   HERNIA REPAIR         Home Medications    Prior to Admission medications   Medication Sig Start Date End Date Taking? Authorizing Provider  dicyclomine (BENTYL) 20 MG tablet Take 1 tablet (20 mg total) by mouth 3 (three) times daily as needed for spasms. 10/20/21  Yes Trevor Ihaagan, Micah Barnier, FNP  aspirin 81 MG chewable tablet Chew by mouth daily.    [provider]  atorvastatin (LIPITOR) 80 MG tablet Take 80 mg by mouth daily.    [provider]  benzonatate (TESSALON) 200 MG capsule Take 1 capsule (200 mg total) by mouth 3 (three) times daily as needed for cough. 09/16/21   Eustace MooreNelson, Yvonne Sue, MD  dextromethorphan-guaiFENesin [facility](MUCINEX DM) 30-600 MG 12hr tablet Take 1 tablet by mouth 2 (two) times daily. 09/16/21   Eustace MooreNelson, Yvonne Sue, MD  glimepiride (AMARYL) 4 MG tablet Take 1 tablet by mouth twice daily 06/26/20   Sunnie NielsenAlexander, Natalie, DO  metFORMIN (GLUCOPHAGE)  1000 MG tablet Take 1 tablet (1,000 mg total) by mouth 2 (two) times daily with a meal. 01/10/20   Sunnie NielsenAlexander, Natalie, DO  ticagrelor (BRILINTA) 90 MG TABS tablet Take 90 mg by mouth 2 (two) times daily.    [provider]    Family History Family History  Problem Relation Age of Onset   Cancer Mother        breast and stomach   Cancer Father        colon and prostate    Social History Social History   Tobacco Use   Smoking status: Former   Smokeless tobacco: Never  Building services engineerVaping Use   Vaping Use: Never used  Substance Use Topics   Alcohol use: No   Drug use: No     Allergies   Prednisone   Review of Systems Review of Systems  Gastrointestinal:  Positive for diarrhea.       Loose stools/diarrhea x 3 weeks    Physical Exam Triage Vital Signs ED Triage Vitals [10/20/21 0821]  Enc Vitals Group     BP (!) 162/84     Pulse Rate 89     Resp 18     Temp 97.9 F (36.6 C)     Temp Source Oral     SpO2 99 %     Weight      Height      Head Circumference  Peak Flow      Pain Score 0     Pain Loc      Pain Edu?      Excl. in GC?    No data found.  Updated Vital Signs BP (!) 162/84 (BP Location: Left Arm)    Pulse 89    Temp 97.9 F (36.6 C) (Oral)    Resp 18    SpO2 99%      Physical Exam Vitals and nursing note reviewed.  Constitutional:      General: He is not in acute distress.    Appearance: Normal appearance. He is obese. He is not ill-appearing.  HENT:     Head: Normocephalic and atraumatic.     Mouth/Throat:     Mouth: Mucous membranes are moist.     Pharynx: Oropharynx is clear.  Eyes:     Extraocular Movements: Extraocular movements intact.     Conjunctiva/sclera: Conjunctivae normal.     Pupils: Pupils are equal, round, and reactive to light.  Cardiovascular:     Rate and Rhythm: Normal rate and regular rhythm.     Pulses: Normal pulses.     Heart sounds: Normal heart sounds.  Pulmonary:     Effort: Pulmonary effort is normal.      Breath sounds: Normal breath sounds.  Abdominal:     General: Bowel sounds are normal. There is no distension.     Palpations: Abdomen is soft. There is no mass.     Tenderness: There is no abdominal tenderness. There is no right CVA tenderness, left CVA tenderness, guarding or rebound.     Hernia: No hernia is present.  Musculoskeletal:     Cervical back: Normal range of motion and neck supple.  Skin:    General: Skin is warm and dry.  Neurological:     General: No focal deficit present.     Mental Status: He is alert and oriented to person, place, and time. Mental status is at baseline.     UC Treatments / Results  Labs (all labs ordered are listed, but only abnormal results are displayed) Labs Reviewed - No data to display  EKG   Radiology No results found.  Procedures Procedures (including critical care time)  Medications Ordered in UC Medications - No data to display  Initial Impression / Assessment and Plan / UC Course  I have reviewed the triage vital signs and the nursing notes.  Pertinent labs & imaging results that were available during my care of the patient were reviewed by me and considered in my medical decision making (see chart for details).     MDM: 1.  Diarrhea, unspecified-Strongly encouraged patient to follow-up with his PCP for overdue colonoscopy and baseline labs.  Advised patient given his Father's history first colonoscopy should have been at age 22.  Advised patient may use Bentyl daily or as needed for diarrhea.  Encouraged patient to adhere to bland diet gradually returning to normal diet.  Encouraged patient increase daily water intake while taking this medication.  Patient discharged home, hemodynamically stable. Final Clinical Impressions(s) / UC Diagnoses   Final diagnoses:  Diarrhea, unspecified type     Discharge Instructions      Strongly encouraged patient to follow-up with his PCP for overdue colonoscopy and baseline labs.   Advised patient given his Father's history first colonoscopy should have been at age 92.  Advised patient may use Bentyl daily or as needed for diarrhea.  Encouraged patient to adhere to bland  diet gradually returning to normal diet.  Encouraged patient increase daily water intake while taking this medication.     ED Prescriptions     Medication Sig Dispense Auth. Provider   dicyclomine (BENTYL) 20 MG tablet Take 1 tablet (20 mg total) by mouth 3 (three) times daily as needed for spasms. 30 tablet Trevor Iha, FNP      PDMP not reviewed this encounter.   Trevor Iha, FNP 10/20/21 [phone]

## 2021-10-23 ENCOUNTER — Encounter: Payer: Self-pay | Admitting: Physician Assistant

## 2021-10-23 ENCOUNTER — Telehealth (INDEPENDENT_AMBULATORY_CARE_PROVIDER_SITE_OTHER): Payer: Self-pay | Admitting: Physician Assistant

## 2021-10-23 VITALS — BP 120/80 | HR 78

## 2021-10-23 DIAGNOSIS — Z125 Encounter for screening for malignant neoplasm of prostate: Secondary | ICD-10-CM

## 2021-10-23 DIAGNOSIS — I10 Essential (primary) hypertension: Secondary | ICD-10-CM

## 2021-10-23 DIAGNOSIS — Z79899 Other long term (current) drug therapy: Secondary | ICD-10-CM

## 2021-10-23 DIAGNOSIS — E1121 Type 2 diabetes mellitus with diabetic nephropathy: Secondary | ICD-10-CM | POA: Insufficient documentation

## 2021-10-23 DIAGNOSIS — Z800 Family history of malignant neoplasm of digestive organs: Secondary | ICD-10-CM

## 2021-10-23 DIAGNOSIS — E785 Hyperlipidemia, unspecified: Secondary | ICD-10-CM

## 2021-10-23 DIAGNOSIS — R197 Diarrhea, unspecified: Secondary | ICD-10-CM

## 2021-10-23 DIAGNOSIS — Z1211 Encounter for screening for malignant neoplasm of colon: Secondary | ICD-10-CM

## 2021-10-23 DIAGNOSIS — Z794 Long term (current) use of insulin: Secondary | ICD-10-CM

## 2021-10-23 DIAGNOSIS — Z955 Presence of coronary angioplasty implant and graft: Secondary | ICD-10-CM

## 2021-10-23 LAB — COMPLETE METABOLIC PANEL WITH GFR
AG Ratio: 1.5 (calc) (ref 1.0–2.5)
ALT: 9 U/L (ref 9–46)
AST: 11 U/L (ref 10–35)
Albumin: 4 g/dL (ref 3.6–5.1)
Alkaline phosphatase (APISO): 76 U/L (ref 35–144)
BUN: 14 mg/dL (ref 7–25)
CO2: 28 mmol/L (ref 20–32)
Calcium: 9.6 mg/dL (ref 8.6–10.3)
Chloride: 102 mmol/L (ref 98–110)
Creat: 0.92 mg/dL (ref 0.70–1.35)
Globulin: 2.6 g/dL (calc) (ref 1.9–3.7)
Glucose, Bld: 226 mg/dL — ABNORMAL HIGH (ref 65–99)
Potassium: 4.7 mmol/L (ref 3.5–5.3)
Sodium: 137 mmol/L (ref 135–146)
Total Bilirubin: 0.4 mg/dL (ref 0.2–1.2)
Total Protein: 6.6 g/dL (ref 6.1–8.1)
eGFR: 95 mL/min/{1.73_m2} (ref >=60)

## 2021-10-23 LAB — HEMOGLOBIN A1C
Hgb A1c MFr Bld: 11.3 % of total Hgb — ABNORMAL HIGH (ref <5.7)
Mean Plasma Glucose: 278 mg/dL
eAG (mmol/L): 15.4 mmol/L

## 2021-10-23 LAB — CBC WITH DIFFERENTIAL/PLATELET
Absolute Monocytes: 696 cells/uL (ref 200–950)
Basophils Absolute: 107 cells/uL (ref 0–200)
Basophils Relative: 1 %
Eosinophils Absolute: 1937 cells/uL — ABNORMAL HIGH (ref 15–500)
Eosinophils Relative: 18.1 %
HCT: 43.5 % (ref 38.5–50.0)
Hemoglobin: 14.8 g/dL (ref 13.2–17.1)
Lymphs Abs: 2418 cells/uL (ref 850–3900)
MCH: 31.7 pg (ref 27.0–33.0)
MCHC: 34 g/dL (ref 32.0–36.0)
MCV: 93.1 fL (ref 80.0–100.0)
MPV: 9.8 fL (ref 7.5–12.5)
Monocytes Relative: 6.5 %
Neutro Abs: 5543 cells/uL (ref 1500–7800)
Neutrophils Relative %: 51.8 %
Platelets: 308 10*3/uL (ref 140–400)
RBC: 4.67 10*6/uL (ref 4.20–5.80)
RDW: 12.5 % (ref 11.0–15.0)
Total Lymphocyte: 22.6 %
WBC: 10.7 10*3/uL (ref 3.8–10.8)

## 2021-10-23 LAB — LIPID PANEL W/REFLEX DIRECT LDL
Cholesterol: 168 mg/dL (ref <200)
HDL: 41 mg/dL (ref >=40)
LDL Cholesterol (Calc): 104 mg/dL (calc) — ABNORMAL HIGH
Non-HDL Cholesterol (Calc): 127 mg/dL (calc) (ref <130)
Total CHOL/HDL Ratio: 4.1 (calc) (ref <5.0)
Triglycerides: 130 mg/dL (ref <150)

## 2021-10-23 LAB — PSA: PSA: 2.06 ng/mL (ref <=4.00)

## 2021-10-23 LAB — TSH: TSH: 1.42 mIU/L (ref 0.40–4.50)

## 2021-10-23 MED ORDER — GLIMEPIRIDE 4 MG PO TABS: 4.0000 mg | ORAL_TABLET | Freq: Two times a day (BID) | ORAL | 0 refills | Status: DC

## 2021-10-23 MED ORDER — METFORMIN HCL 1000 MG PO TABS: 1000.0000 mg | ORAL_TABLET | Freq: Two times a day (BID) | ORAL | 0 refills | Status: DC

## 2021-10-23 NOTE — Progress Notes (Signed)
..Virtual Visit via Telephone Note  I connected with Travis Roach on 10/23/21 at  9:50 AM EST by telephone and verified that I am speaking with the correct person using two identifiers.  Location: Patient: home Provider: clinic  .[name] KitchenParticipating in visit:  Patient: Travis Roach Provider: Tandy Gaw PA-C    I discussed the limitations, risks, security and privacy concerns of performing an evaluation and management service by telephone and the availability of in person appointments. I also discussed with the patient that there may be a patient responsible charge related to this service. The patient expressed understanding and agreed to proceed.   History of Present Illness: Pt is a 61 yo male with loose stools/diarrhea for over 3 weeks.  PMH significant for HTN, MI, T2DM.   Pt states he has had several diarrhea episodes that he's concerned about his last episode that had lasted a week he started a brat diet and its helped but stool is soft, pt went to urgent care and was placed on dicyclomine. Pt states he needs rx refill on metformin,glimepiride medication.  Mid summer  October  Belch burp and loose stools  Happened over the weekend 4-5 bathroom every hour for 24 hours  BRAT diet helping.   This morning 260 sugars No pain with abdominal  No blood in stools. Grassy green color oil in water  No country.   .. Active Ambulatory Problems    Diagnosis Date Noted   Uncontrolled type 2 diabetes mellitus without complication, without long-term current use of insulin 05/07/2017   Essential hypertension 05/07/2017   Family history of colon cancer in father 10/23/2021   Diarrhea 10/23/2021   S/P primary angioplasty with coronary stent 10/23/2021   Controlled type 2 diabetes mellitus with diabetic nephropathy, with long-term current use of insulin (HCC) 10/23/2021   Resolved Ambulatory Problems    Diagnosis Date Noted   No Resolved Ambulatory Problems   Past Medical History:   Diagnosis Date   Diabetes mellitus without complication (HCC)    Hypercholesteremia    Hypertension    MI (myocardial infarction) (HCC)      Observations/Objective: No acute distress Normal mood  .[name] Kitchen Today's Vitals   10/23/21 0953  BP: 120/80  Pulse: 78   There is no height or weight on file to calculate BMI.    Assessment and Plan: [name] KitchenMarland KitchenDiagnoses and all orders for this visit:  Diarrhea, unspecified type -     Ambulatory referral to Gastroenterology -     Salmonella/Shigella Cult, Campy EIA and Shiga Toxin reflex -     Clostridium difficile culture-fecal -     Ova and parasite examination  Family history of colon cancer in father -     Ambulatory referral to Gastroenterology  Controlled type 2 diabetes mellitus with diabetic nephropathy, with long-term current use of insulin (HCC) -     Hemoglobin A1c  Essential hypertension  S/P primary angioplasty with coronary stent  Controlled type 2 diabetes mellitus with diabetic nephropathy, without long-term current use of insulin (HCC) -     glimepiride (AMARYL) 4 MG tablet; Take 1 tablet (4 mg total) by mouth 2 (two) times daily. -     metFORMIN (GLUCOPHAGE) 1000 MG tablet; Take 1 tablet (1,000 mg total) by mouth 2 (two) times daily with a meal.  Colon cancer screening -     Ambulatory referral to Gastroenterology  Prostate cancer screening -     PSA  Dyslipidemia, goal LDL below 70 -     Lipid Panel  w/reflex Direct LDL  Medication management -     COMPLETE METABOLIC PANEL WITH GFR -     CBC with Differential/Platelet -     TSH   Unclear etiology of symptoms Evaluate with stool culture Needs labs BRAT diet BP looks good Needs colonoscopy. Referral placed.     Follow Up Instructions:    I discussed the assessment and treatment plan with the patient. The patient was provided an opportunity to ask questions and all were answered. The patient agreed with the plan and demonstrated an understanding of the  instructions.   The patient was advised to call back or seek an in-person evaluation if the symptoms worsen or if the condition fails to improve as anticipated.  I provided 20 minutes of non-face-to-face time during this encounter.   Tandy Gaw, PA-C

## 2021-10-24 NOTE — Progress Notes (Signed)
Tres,   Thyroid looks great.  PSA normal range.  Your glucose is very elevated and could be the reason for a lot of your symptoms. You need to come in and talk about a better diabetes plan. (In office 40 minute) Kidney, liver look good.  HDL is good.  TG is good.  LDL 104 but not to the diabetic goal of under 70.  Your eosinophils are elevated indicating allergic reaction.

## 2021-10-26 NOTE — Progress Notes (Signed)
No bacteria in stool culture.

## 2021-10-30 LAB — SALMONELLA/SHIGELLA CULT, CAMPY EIA AND SHIGA TOXIN RFL ECOLI
MICRO NUMBER: 12888090
MICRO NUMBER:: 12888091
MICRO NUMBER:: 12888092
Result:: NOT DETECTED
SHIGA RESULT:: NOT DETECTED
SPECIMEN QUALITY: ADEQUATE
SPECIMEN QUALITY:: ADEQUATE
SPECIMEN QUALITY:: ADEQUATE

## 2021-10-30 LAB — OVA AND PARASITE EXAMINATION
CONCENTRATE RESULT:: NONE SEEN
MICRO NUMBER:: 12888059
SPECIMEN QUALITY:: ADEQUATE
TRICHROME RESULT:: NONE SEEN

## 2021-10-30 LAB — CLOSTRIDIUM DIFFICILE CULTURE-FECAL

## 2021-10-30 NOTE — Progress Notes (Signed)
Eosinophils are pretty elevated. This usually meals allergy. Do you normally take any allergy medications?

## 2021-10-30 NOTE — Progress Notes (Signed)
No ova or parasites.

## 2021-10-31 NOTE — Progress Notes (Signed)
No C.diff. how are your symptoms?

## 2021-11-06 ENCOUNTER — Ambulatory Visit (INDEPENDENT_AMBULATORY_CARE_PROVIDER_SITE_OTHER): Payer: Self-pay | Admitting: Physician Assistant

## 2021-11-06 ENCOUNTER — Other Ambulatory Visit: Payer: Self-pay

## 2021-11-06 VITALS — BP 108/84 | HR 89 | Ht 68.0 in | Wt 198.0 lb

## 2021-11-06 DIAGNOSIS — E1165 Type 2 diabetes mellitus with hyperglycemia: Secondary | ICD-10-CM

## 2021-11-06 DIAGNOSIS — Z955 Presence of coronary angioplasty implant and graft: Secondary | ICD-10-CM

## 2021-11-06 DIAGNOSIS — E785 Hyperlipidemia, unspecified: Secondary | ICD-10-CM

## 2021-11-06 DIAGNOSIS — I10 Essential (primary) hypertension: Secondary | ICD-10-CM

## 2021-11-06 DIAGNOSIS — D721 Eosinophilia, unspecified: Secondary | ICD-10-CM

## 2021-11-06 DIAGNOSIS — Z1211 Encounter for screening for malignant neoplasm of colon: Secondary | ICD-10-CM

## 2021-11-06 MED ORDER — ATORVASTATIN CALCIUM 80 MG PO TABS: 80.0000 mg | ORAL_TABLET | Freq: Every day | ORAL | 3 refills | Status: DC

## 2021-11-06 NOTE — Progress Notes (Signed)
Subjective:    Patient ID: Travis Roach, male    DOB: 06-02-1961, 61 y.o.   MRN: [id number]  HPI Pt is a 61 yo male with HTN, T2DM, history of MI, HLD who presents to the clinic after labs drawn with elevated A1C and eosinophils.   Pt does not have insurance and admits that he has not been following up due to cost and "thinking he could do it on his on". He has been diabetic since 2007 but controlled mostly with diet and metformin. He has not been checking sugars until recently. For the last week sugars have been 150-190. He admits he was not taking metformin or glimeperide. He has not been watching his diet or exercise. Denies any CP, palpitations, headaches or vision changes. He did have MI in Jan 2021 and had stent placement. Not taking brilinta or lipitor. Not seen cardiology. Last LDL was 104. He has had intermittent diarrhea and loose stools and seems to be after Brilinta so he has not been taking.   He wonders why eosinophils are elevated.   .. Active Ambulatory Problems    Diagnosis Date Noted   Uncontrolled type 2 diabetes mellitus without complication, without long-term current use of insulin 05/07/2017   Essential hypertension 05/07/2017   Family history of colon cancer in father 10/23/2021   Diarrhea 10/23/2021   S/P primary angioplasty with coronary stent 10/23/2021   Controlled type 2 diabetes mellitus with diabetic nephropathy, with long-term current use of insulin ([place]) 10/23/2021   Uncontrolled type 2 diabetes mellitus with hyperglycemia ([place]) 11/06/2021   Dyslipidemia, goal LDL below 70 11/07/2021   Eosinophilia 11/07/2021   Resolved Ambulatory Problems    Diagnosis Date Noted   No Resolved Ambulatory Problems   Past Medical History:  Diagnosis Date   Diabetes mellitus without complication ([place])    Hypercholesteremia    Hypertension    MI (myocardial infarction) ([place])       Review of Systems See HPI.     Objective:   Physical Exam Vitals reviewed.   Constitutional:      Appearance: Normal appearance. He is obese.  HENT:     Head: Normocephalic.  Neck:     Vascular: No carotid bruit.  Cardiovascular:     Rate and Rhythm: Normal rate and regular rhythm.     Pulses: Normal pulses.     Heart sounds: Normal heart sounds.  Pulmonary:     Effort: Pulmonary effort is normal.     Breath sounds: Normal breath sounds.  Neurological:     General: No focal deficit present.     Mental Status: He is alert and oriented to person, place, and time.  Psychiatric:        Mood and Affect: Mood normal.          Assessment & Plan:  [name] KitchenMarland KitchenRashaun was seen today for follow-up.  Diagnoses and all orders for this visit:  Uncontrolled type 2 diabetes mellitus with hyperglycemia (HCC)  Dyslipidemia, goal LDL below 70 -     atorvastatin (LIPITOR) 80 MG tablet; Take 1 tablet (80 mg total) by mouth daily.  S/P primary angioplasty with coronary stent -     atorvastatin (LIPITOR) 80 MG tablet; Take 1 tablet (80 mg total) by mouth daily.  Essential hypertension  Colon cancer screening -     Ambulatory referral to Gastroenterology  Eosinophilia, unspecified type -     CBC with Differential/Platelet   Last A1C was 11.3. Discussed risk factors of not controlling this.  Discussed DM diet and importance of exercise 150 minutes a week Continue on metformin and glimeperide.  Right now medications are affordable and sugars are coming down Cost will be an issue for patient with branded drugs BP to goal. Hx of dropping too low on BP medications Not taking statin start lipitor back daily Continue ASA due to stent placement Eye exam UTD. Declines covid vaccine/pneumoccal/shingrix/flu due to cost.   Family hx of colon cancer with father. Urged him to get this done. Referral placed.   Recheck CBC due to elevated eosinophils.   Follow up in 3 months.

## 2021-11-06 NOTE — Patient Instructions (Addendum)
[employer] underwriter lipitor daily. Continue metformin and glimepride  Diabetes Mellitus and Nutrition, Adult When you have diabetes, or diabetes mellitus, it is very important to have healthy eating habits because your blood sugar (glucose) levels are greatly affected by what you eat and drink. Eating healthy foods in the right amounts, at about the same times every day, can help you: Manage your blood glucose. Lower your risk of heart disease. Improve your blood pressure. Reach or maintain a healthy weight. What can affect my meal plan? Every person with diabetes is different, and each person has different needs for a meal plan. Your health care provider may recommend that you work with a dietitian to make a meal plan that is best for you. Your meal plan may vary depending on factors such as: The calories you need. The medicines you take. Your weight. Your blood glucose, blood pressure, and cholesterol levels. Your activity level. Other health conditions you have, such as heart or kidney disease. How do carbohydrates affect me? Carbohydrates, also called carbs, affect your blood glucose level more than any other type of food. Eating carbs raises the amount of glucose in your blood. It is important to know how many carbs you can safely have in each meal. This is different for every person. Your dietitian can help you calculate how many carbs you should have at each meal and for each snack. How does alcohol affect me? Alcohol can cause a decrease in blood glucose (hypoglycemia), especially if you use insulin or take certain diabetes medicines by mouth. Hypoglycemia can be a life-threatening condition. Symptoms of hypoglycemia, such as sleepiness, dizziness, and confusion, are similar to symptoms of having too much alcohol. Do not drink alcohol if: Your health care provider tells you not to drink. You are pregnant, may be pregnant, or are planning to become pregnant. If you drink  alcohol: Limit how much you have to: 0-1 drink a day for women. 0-2 drinks a day for men. Know how much alcohol is in your drink. In the [place] one drink equals one 12 oz bottle of beer (355 mL), one 5 oz glass of wine (148 mL), or one 1 oz glass of hard liquor (44 mL). Keep yourself hydrated with water, diet soda, or unsweetened iced tea. Keep in mind that regular soda, juice, and other mixers may contain a lot of sugar and must be counted as carbs. What are tips for following this plan? Reading food labels Start by checking the serving size on the Nutrition Facts label of packaged foods and drinks. The number of calories and the amount of carbs, fats, and other nutrients listed on the label are based on one serving of the item. Many items contain more than one serving per package. Check the total grams (g) of carbs in one serving. Check the number of grams of saturated fats and trans fats in one serving. Choose foods that have a low amount or none of these fats. Check the number of milligrams (mg) of salt (sodium) in one serving. Most people should limit total sodium intake to less than 2,300 mg per day. Always check the nutrition information of foods labeled as "low-fat" or "nonfat." These foods may be higher in added sugar or refined carbs and should be avoided. Talk to your dietitian to identify your daily goals for nutrients listed on the label. Shopping Avoid buying canned, pre-made, or processed foods. These foods tend to be high in fat, sodium, and added sugar. Shop around the  outside edge of the grocery store. This is where you will most often find fresh fruits and vegetables, bulk grains, fresh meats, and fresh dairy products. Cooking Use low-heat cooking methods, such as baking, instead of high-heat cooking methods, such as deep frying. Cook using healthy oils, such as olive, canola, or sunflower oil. Avoid cooking with butter, cream, or high-fat meats. Meal planning Eat meals and  snacks regularly, preferably at the same times every day. Avoid going long periods of time without eating. Eat foods that are high in fiber, such as fresh fruits, vegetables, beans, and whole grains. Eat 4-6 oz (112-168 g) of lean protein each day, such as lean meat, chicken, fish, eggs, or tofu. One ounce (oz) (28 g) of lean protein is equal to: 1 oz (28 g) of meat, chicken, or fish. 1 egg.  cup (62 g) of tofu. Eat some foods each day that contain healthy fats, such as avocado, nuts, seeds, and fish. What foods should I eat? Fruits Berries. Apples. Oranges. Peaches. Apricots. Plums. Grapes. Mangoes. Papayas. Pomegranates. Kiwi. Cherries. Vegetables Leafy greens, including lettuce, spinach, kale, chard, collard greens, mustard greens, and cabbage. Beets. Cauliflower. Broccoli. Carrots. Green beans. Tomatoes. Peppers. Onions. Cucumbers. Brussels sprouts. Grains Whole grains, such as whole-wheat or whole-grain bread, crackers, tortillas, cereal, and pasta. Unsweetened oatmeal. Quinoa. Brown or wild rice. Meats and other proteins Seafood. Poultry without skin. Lean cuts of poultry and beef. Tofu. Nuts. Seeds. Dairy Low-fat or fat-free dairy products such as milk, yogurt, and cheese. The items listed above may not be a complete list of foods and beverages you can eat and drink. Contact a dietitian for more information. What foods should I avoid? Fruits Fruits canned with syrup. Vegetables Canned vegetables. Frozen vegetables with butter or cream sauce. Grains Refined white flour and flour products such as bread, pasta, snack foods, and cereals. Avoid all processed foods. Meats and other proteins Fatty cuts of meat. Poultry with skin. Breaded or fried meats. Processed meat. Avoid saturated fats. Dairy Full-fat yogurt, cheese, or milk. Beverages Sweetened drinks, such as soda or iced tea. The items listed above may not be a complete list of foods and beverages you should avoid. Contact a  dietitian for more information. Questions to ask a health care provider Do I need to meet with a certified diabetes care and education specialist? Do I need to meet with a dietitian? What number can I call if I have questions? When are the best times to check my blood glucose? Where to find more information: American Diabetes Association: diabetes.org Academy of Nutrition and Dietetics: eatright.[employer] of Diabetes and Digestive and Kidney Diseases: StageSync.si Association of Diabetes Care & Education Specialists: diabeteseducator.org Summary It is important to have healthy eating habits because your blood sugar (glucose) levels are greatly affected by what you eat and drink. It is important to use alcohol carefully. A healthy meal plan will help you manage your blood glucose and lower your risk of heart disease. Your health care provider may recommend that you work with a dietitian to make a meal plan that is best for you. This information is not intended to replace advice given to you by your health care provider. Make sure you discuss any questions you have with your health care provider. Document Revised: 04/26/2020 Document Reviewed: 04/26/2020 Elsevier Patient Education  2022 [employer].

## 2021-11-06 NOTE — Progress Notes (Deleted)
Patient ID: Travis Roach, male   DOB: 01-11-1961, 61 y.o.   MRN: [id number]

## 2021-11-07 DIAGNOSIS — D721 Eosinophilia, unspecified: Secondary | ICD-10-CM | POA: Insufficient documentation

## 2021-11-07 DIAGNOSIS — E785 Hyperlipidemia, unspecified: Secondary | ICD-10-CM | POA: Insufficient documentation

## 2021-11-07 LAB — CBC WITH DIFFERENTIAL/PLATELET
Absolute Monocytes: 770 cells/uL (ref 200–950)
Basophils Absolute: 80 cells/uL (ref 0–200)
Basophils Relative: 0.8 %
Eosinophils Absolute: 670 cells/uL — ABNORMAL HIGH (ref 15–500)
Eosinophils Relative: 6.7 %
HCT: 45 % (ref 38.5–50.0)
Hemoglobin: 15.4 g/dL (ref 13.2–17.1)
Lymphs Abs: 2730 cells/uL (ref 850–3900)
MCH: 32.2 pg (ref 27.0–33.0)
MCHC: 34.2 g/dL (ref 32.0–36.0)
MCV: 94.1 fL (ref 80.0–100.0)
MPV: 10 fL (ref 7.5–12.5)
Monocytes Relative: 7.7 %
Neutro Abs: 5750 cells/uL (ref 1500–7800)
Neutrophils Relative %: 57.5 %
Platelets: 282 10*3/uL (ref 140–400)
RBC: 4.78 10*6/uL (ref 4.20–5.80)
RDW: 12.8 % (ref 11.0–15.0)
Total Lymphocyte: 27.3 %
WBC: 10 10*3/uL (ref 3.8–10.8)

## 2021-11-07 NOTE — Progress Notes (Signed)
Eosinophils coming down which is great news.

## 2021-11-13 ENCOUNTER — Encounter: Payer: Self-pay | Admitting: Physician Assistant

## 2021-11-15 ENCOUNTER — Encounter: Payer: Self-pay | Admitting: Physician Assistant

## 2021-12-14 ENCOUNTER — Ambulatory Visit: Payer: Self-pay | Admitting: Medical-Surgical

## 2022-02-08 ENCOUNTER — Ambulatory Visit (INDEPENDENT_AMBULATORY_CARE_PROVIDER_SITE_OTHER): Payer: Self-pay | Admitting: Physician Assistant

## 2022-02-08 ENCOUNTER — Encounter: Payer: Self-pay | Admitting: Physician Assistant

## 2022-02-08 VITALS — BP 128/74 | HR 69 | Ht 68.0 in | Wt 202.0 lb

## 2022-02-08 DIAGNOSIS — E1165 Type 2 diabetes mellitus with hyperglycemia: Secondary | ICD-10-CM

## 2022-02-08 DIAGNOSIS — E785 Hyperlipidemia, unspecified: Secondary | ICD-10-CM

## 2022-02-08 DIAGNOSIS — Z794 Long term (current) use of insulin: Secondary | ICD-10-CM

## 2022-02-08 DIAGNOSIS — I10 Essential (primary) hypertension: Secondary | ICD-10-CM

## 2022-02-08 DIAGNOSIS — Z1211 Encounter for screening for malignant neoplasm of colon: Secondary | ICD-10-CM

## 2022-02-08 DIAGNOSIS — Z955 Presence of coronary angioplasty implant and graft: Secondary | ICD-10-CM

## 2022-02-08 DIAGNOSIS — E1121 Type 2 diabetes mellitus with diabetic nephropathy: Secondary | ICD-10-CM

## 2022-02-08 LAB — POCT GLYCOSYLATED HEMOGLOBIN (HGB A1C): Hemoglobin A1C: 5.8 % — AB (ref 4.0–5.6)

## 2022-02-08 MED ORDER — ATORVASTATIN CALCIUM 80 MG PO TABS: 80.0000 mg | ORAL_TABLET | Freq: Every day | ORAL | 3 refills | Status: DC

## 2022-02-08 MED ORDER — METFORMIN HCL 1000 MG PO TABS: 1000.0000 mg | ORAL_TABLET | Freq: Two times a day (BID) | ORAL | 0 refills | Status: DC

## 2022-02-08 MED ORDER — GLIMEPIRIDE 4 MG PO TABS: 4.0000 mg | ORAL_TABLET | Freq: Two times a day (BID) | ORAL | 0 refills | Status: DC

## 2022-02-08 NOTE — Progress Notes (Signed)
? ?Established Patient Office Visit ? ?Subjective   ?Patient ID: Travis Roach, male    DOB: Jan 17, 1961  Age: 61 y.o. MRN: [id number] ? ?Chief Complaint  ?Patient presents with  ? Follow-up  ? ? ?HPI ?Pt is a 61 yo male with T2DM, HLD who presents to the clinic for medication refills.  ? ?Pt has not been checking sugars. He has had a few hypoglycemic symptoms if not eating frequently. No open sores or wounds. Taking metformin and glimeperide. No CP, palpitations, headaches or vision changes. He is watching his diet and exercising.  ? ? ?Patient Active Problem List  ? Diagnosis Date Noted  ? Dyslipidemia, goal LDL below 70 11/07/2021  ? Eosinophilia 11/07/2021  ? Uncontrolled type 2 diabetes mellitus with hyperglycemia ([place]) 11/06/2021  ? Family history of colon cancer in father 10/23/2021  ? Diarrhea 10/23/2021  ? S/P primary angioplasty with coronary stent 10/23/2021  ? Controlled type 2 diabetes mellitus with diabetic nephropathy, with long-term current use of insulin ([place]) 10/23/2021  ? Uncontrolled type 2 diabetes mellitus without complication, without long-term current use of insulin 05/07/2017  ? Essential hypertension 05/07/2017  ? ?Past Medical History:  ?Diagnosis Date  ? Diabetes mellitus without complication ([place])   ? Hypercholesteremia   ? Hypertension   ? MI (myocardial infarction) ([place])   ? ?Family History  ?Problem Relation Age of Onset  ? Cancer Mother   ?     breast and stomach  ? Cancer Father   ?     colon and prostate  ? ?Allergies  ?Allergen Reactions  ? Prednisone   ?  Elevated blood sugar   ? ?  ? ?ROS ? ?  ?Objective:  ?  ? ?BP 128/74 (BP Location: Left Arm, Patient Position: Sitting, Cuff Size: Large)   Pulse 69   Ht 5\' 8"  (1.727 m)   Wt 202 lb (91.6 kg)   SpO2 99%   BMI 30.71 kg/m?  ?BP Readings from Last 3 Encounters:  ?02/08/22 128/74  ?11/06/21 108/84  ?10/23/21 120/80  ? ?Wt Readings from Last 3 Encounters:  ?02/08/22 202 lb (91.6 kg)  ?11/06/21 198 lb (89.8 kg)  ?09/16/21 194 lb  (88 kg)  ? ?  ? ?Physical Exam ?Vitals reviewed.  ?Constitutional:   ?   Appearance: Normal appearance. He is obese.  ?HENT:  ?   Head: Normocephalic.  ?Neck:  ?   Vascular: No carotid bruit.  ?Cardiovascular:  ?   Rate and Rhythm: Normal rate and regular rhythm.  ?   Pulses: Normal pulses.  ?   Heart sounds: Normal heart sounds.  ?Pulmonary:  ?   Effort: Pulmonary effort is normal.  ?   Breath sounds: Normal breath sounds.  ?Lymphadenopathy:  ?   Cervical: No cervical adenopathy.  ?Neurological:  ?   General: No focal deficit present.  ?   Mental Status: He is alert and oriented to person, place, and time.  ?Psychiatric:     ?   Mood and Affect: Mood normal.  ? ? ? ?.. ?Results for orders placed or performed in visit on 02/08/22  ?POCT glycosylated hemoglobin (Hb A1C)  ?Result Value Ref Range  ? Hemoglobin A1C 5.8 (A) 4.0 - 5.6 %  ? HbA1c POC (<> result, manual entry)    ? HbA1c, POC (prediabetic range)    ? HbA1c, POC (controlled diabetic range)    ? ?.. ?Diabetic Foot Exam - Simple   ?Simple Foot Form ?Visual Inspection ?No deformities, no ulcerations,  no other skin breakdown bilaterally: Yes ?Sensation Testing ?Intact to touch and monofilament testing bilaterally: Yes ?Pulse Check ?Posterior Tibialis and Dorsalis pulse intact bilaterally: Yes ?Comments ?  ? ? ?  ?Assessment & Plan:  ?..Diago was seen today for follow-up. ? ?Diagnoses and all orders for this visit: ? ?Uncontrolled type 2 diabetes mellitus with hyperglycemia (HCC) ?-     POCT glycosylated hemoglobin (Hb A1C) ? ?Essential hypertension ? ?Controlled type 2 diabetes mellitus with diabetic nephropathy, with long-term current use of insulin ([place]) ? ?Dyslipidemia, goal LDL below 70 ?-     atorvastatin (LIPITOR) 80 MG tablet; Take 1 tablet (80 mg total) by mouth daily. ? ?Controlled type 2 diabetes mellitus with diabetic nephropathy, without long-term current use of insulin (HCC) ?-     glimepiride (AMARYL) 4 MG tablet; Take 1 tablet (4 mg total) by  mouth 2 (two) times daily. ?-     metFORMIN (GLUCOPHAGE) 1000 MG tablet; Take 1 tablet (1,000 mg total) by mouth 2 (two) times daily with a meal. ? ?S/P primary angioplasty with coronary stent ?-     atorvastatin (LIPITOR) 80 MG tablet; Take 1 tablet (80 mg total) by mouth daily. ? ?Colon cancer screening ?-     Ambulatory referral to Gastroenterology ? ? ?A1C is down from 11.3 to 5.8.  ?Pt is doing great.  ?Stay on same medications. Sent refills.  ?BP is great.  ?On statin.  ?Foot exam UTD.  ?Scheduled eye exam.  ?Declined all vaccines.  ?Follow up in 3 months.  ? ? ?Ordered colonoscopy. Pt wishes to schedule after October.  ? ?Return in about 3 months (around 05/11/2022).  ? ? ?Iran Planas, PA-C ? ?

## 2022-02-12 ENCOUNTER — Ambulatory Visit (INDEPENDENT_AMBULATORY_CARE_PROVIDER_SITE_OTHER): Payer: Self-pay | Admitting: Physician Assistant

## 2022-02-12 ENCOUNTER — Encounter: Payer: Self-pay | Admitting: Physician Assistant

## 2022-02-12 VITALS — BP 155/76 | HR 76 | Resp 16 | Ht 68.0 in | Wt 209.0 lb

## 2022-02-12 DIAGNOSIS — R030 Elevated blood-pressure reading, without diagnosis of hypertension: Secondary | ICD-10-CM

## 2022-02-12 DIAGNOSIS — R220 Localized swelling, mass and lump, head: Secondary | ICD-10-CM

## 2022-02-12 DIAGNOSIS — T7840XA Allergy, unspecified, initial encounter: Secondary | ICD-10-CM

## 2022-02-12 MED ORDER — METHYLPREDNISOLONE SODIUM SUCC 125 MG IJ SOLR
125.0000 mg | Freq: Once | INTRAMUSCULAR | Status: AC
  Administered 2022-02-12: 125 mg via INTRAMUSCULAR
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
## 2022-02-12 NOTE — Patient Instructions (Addendum)
Benadryl 25-50mg  today every 4-6hours ?Drink plenty of fluids ? ? ?

## 2022-02-14 ENCOUNTER — Encounter: Payer: Self-pay | Admitting: Physician Assistant

## 2022-02-15 ENCOUNTER — Ambulatory Visit: Payer: Self-pay | Admitting: Physician Assistant

## 2022-05-17 ENCOUNTER — Ambulatory Visit (INDEPENDENT_AMBULATORY_CARE_PROVIDER_SITE_OTHER): Payer: Self-pay | Admitting: Physician Assistant

## 2022-05-17 ENCOUNTER — Encounter: Payer: Self-pay | Admitting: Physician Assistant

## 2022-05-17 VITALS — BP 133/70 | HR 69 | Ht 68.0 in

## 2022-05-17 DIAGNOSIS — E1121 Type 2 diabetes mellitus with diabetic nephropathy: Secondary | ICD-10-CM

## 2022-05-17 DIAGNOSIS — I10 Essential (primary) hypertension: Secondary | ICD-10-CM

## 2022-05-17 DIAGNOSIS — R809 Proteinuria, unspecified: Secondary | ICD-10-CM

## 2022-05-17 DIAGNOSIS — E1165 Type 2 diabetes mellitus with hyperglycemia: Secondary | ICD-10-CM

## 2022-05-17 LAB — POCT GLYCOSYLATED HEMOGLOBIN (HGB A1C): HbA1c, POC (controlled diabetic range): 6 % (ref 0.0–7.0)

## 2022-05-17 LAB — POCT UA - MICROALBUMIN
Creatinine, POC: 300 mg/dL
Microalbumin Ur, POC: 80 mg/L

## 2022-05-17 MED ORDER — METFORMIN HCL 1000 MG PO TABS: 1000.0000 mg | ORAL_TABLET | Freq: Two times a day (BID) | ORAL | 0 refills | Status: DC

## 2022-05-17 MED ORDER — GLIMEPIRIDE 4 MG PO TABS: 4.0000 mg | ORAL_TABLET | Freq: Two times a day (BID) | ORAL | 0 refills | Status: DC

## 2022-05-17 NOTE — Progress Notes (Signed)
   Established Patient Office Visit  Subjective   Patient ID: Travis Roach, male    DOB: March 16, 1961  Age: 61 y.o. MRN: [id number]  Chief Complaint  Patient presents with  . Diabetes    HPI  Morning sugars 80-120  No hypogycemic events More sugar spikes Mashed potat 1-2 beers  .[name] Kitchen Active Ambulatory Problems    Diagnosis Date Noted  . Uncontrolled type 2 diabetes mellitus without complication, without long-term current use of insulin 05/07/2017  . Essential hypertension 05/07/2017  . Family history of colon cancer in father 10/23/2021  . Diarrhea 10/23/2021  . S/P primary angioplasty with coronary stent 10/23/2021  . Controlled type 2 diabetes mellitus with diabetic nephropathy, without long-term current use of insulin (HCC) 10/23/2021  . Uncontrolled type 2 diabetes mellitus with hyperglycemia (HCC) 11/06/2021  . Dyslipidemia, goal LDL below 70 11/07/2021  . Eosinophilia 11/07/2021  . Microalbuminuria 05/17/2022   Resolved Ambulatory Problems    Diagnosis Date Noted  . No Resolved Ambulatory Problems   Past Medical History:  Diagnosis Date  . Diabetes mellitus without complication (HCC)   . Hypercholesteremia   . Hypertension   . MI (myocardial infarction) (HCC)      ROS    Objective:     BP 133/70   Pulse 69   Ht 5\' 8"  (1.727 m)   SpO2 99%   BMI 31.78 kg/m  BP Readings from Last 3 Encounters:  05/17/22 133/70  02/12/22 (!) 155/76  02/08/22 128/74   Wt Readings from Last 3 Encounters:  02/12/22 209 lb (94.8 kg)  02/08/22 202 lb (91.6 kg)  11/06/21 198 lb (89.8 kg)      Physical Exam   Results for orders placed or performed in visit on 05/17/22  POCT glycosylated hemoglobin (Hb A1C)  Result Value Ref Range   Hemoglobin A1C     HbA1c POC (<> result, manual entry)     HbA1c, POC (prediabetic range)     HbA1c, POC (controlled diabetic range) 6.0 0.0 - 7.0 %    {Labs (Optional):23779}  The ASCVD Risk score (Arnett DK, et al., 2019) failed  to calculate for the following reasons:   The patient has a prior MI or stroke diagnosis    Assessment & Plan:   Problem List Items Addressed This Visit       Unprioritized   Essential hypertension   Relevant Orders   COMPLETE METABOLIC PANEL WITH GFR   Uncontrolled type 2 diabetes mellitus with hyperglycemia (HCC) - Primary   Relevant Medications   glimepiride (AMARYL) 4 MG tablet   metFORMIN (GLUCOPHAGE) 1000 MG tablet   Other Relevant Orders   POCT glycosylated hemoglobin (Hb A1C) (Completed)   Other Visit Diagnoses     Controlled type 2 diabetes mellitus with diabetic nephropathy, without long-term current use of insulin (HCC)       Relevant Medications   glimepiride (AMARYL) 4 MG tablet   metFORMIN (GLUCOPHAGE) 1000 MG tablet       No follow-ups on file.    2020, PA-C

## 2022-05-17 NOTE — Patient Instructions (Signed)

## 2022-05-17 NOTE — Progress Notes (Signed)
Your urine showed that you are spilling some protein. When this happens we start medication to help preserve the kidney function like ACE inhibitors. Are you ok with me sending one to pharmacy for you to start?

## 2022-05-20 ENCOUNTER — Encounter: Payer: Self-pay | Admitting: Physician Assistant

## 2022-05-20 MED ORDER — LISINOPRIL 2.5 MG PO TABS: 2.5000 mg | ORAL_TABLET | Freq: Every day | ORAL | 1 refills | Status: DC

## 2022-08-23 ENCOUNTER — Ambulatory Visit (INDEPENDENT_AMBULATORY_CARE_PROVIDER_SITE_OTHER): Payer: Self-pay | Admitting: Physician Assistant

## 2022-08-23 ENCOUNTER — Encounter: Payer: Self-pay | Admitting: Physician Assistant

## 2022-08-23 VITALS — BP 133/85 | HR 75 | Temp 98.5°F | Ht 68.0 in | Wt 209.0 lb

## 2022-08-23 DIAGNOSIS — E785 Hyperlipidemia, unspecified: Secondary | ICD-10-CM

## 2022-08-23 DIAGNOSIS — R809 Proteinuria, unspecified: Secondary | ICD-10-CM

## 2022-08-23 DIAGNOSIS — E1121 Type 2 diabetes mellitus with diabetic nephropathy: Secondary | ICD-10-CM

## 2022-08-23 DIAGNOSIS — R030 Elevated blood-pressure reading, without diagnosis of hypertension: Secondary | ICD-10-CM

## 2022-08-23 DIAGNOSIS — F17200 Nicotine dependence, unspecified, uncomplicated: Secondary | ICD-10-CM | POA: Insufficient documentation

## 2022-08-23 LAB — POCT GLYCOSYLATED HEMOGLOBIN (HGB A1C): Hemoglobin A1C: 6.5 % — AB (ref 4.0–5.6)

## 2022-08-23 MED ORDER — GLIMEPIRIDE 4 MG PO TABS: 4.0000 mg | ORAL_TABLET | Freq: Two times a day (BID) | ORAL | 0 refills | Status: DC

## 2022-08-23 MED ORDER — LISINOPRIL 2.5 MG PO TABS: 2.5000 mg | ORAL_TABLET | Freq: Every day | ORAL | 1 refills | Status: DC

## 2022-08-23 MED ORDER — METFORMIN HCL 1000 MG PO TABS: 1000.0000 mg | ORAL_TABLET | Freq: Two times a day (BID) | ORAL | 0 refills | Status: DC

## 2022-08-23 NOTE — Progress Notes (Signed)
Established Patient Office Visit  Subjective   Patient ID: Travis Roach, male    DOB: 09-Nov-1960  Age: 61 y.o. MRN: [id number]  Chief Complaint  Patient presents with   Follow-up   Diabetes    Diabetes Pertinent negatives for diabetes include no chest pain and no weight loss.    Patient is a 61 yo male with T2DM, HLD, HTN who presents to clinic for 3 month follow up.  Overall, he states he has been feeling good aside from nasal congestion this past month due to allergies. He is checking his blood sugars in the mornings, running around 80-104. He says sometimes he skips his second dose of metformin when he sugars are low to prevent drops in sugar. He says he has been trying to eat healthier with more vegetables and salads. He is walking about 3 to 5 miles on the weekends. Tolerating all medications. Denies any shortness of breath, chest pain, palpitations.  .. Active Ambulatory Problems    Diagnosis Date Noted   Uncontrolled type 2 diabetes mellitus without complication, without long-term current use of insulin 05/07/2017   Essential hypertension 05/07/2017   Family history of colon cancer in father 10/23/2021   Diarrhea 10/23/2021   S/P primary angioplasty with coronary stent 10/23/2021   Controlled type 2 diabetes mellitus with diabetic nephropathy, without long-term current use of insulin (HCC) 10/23/2021   Uncontrolled type 2 diabetes mellitus with hyperglycemia (HCC) 11/06/2021   Dyslipidemia, goal LDL below 70 11/07/2021   Eosinophilia 11/07/2021   Microalbuminuria 05/17/2022   Current smoker 08/23/2022   Resolved Ambulatory Problems    Diagnosis Date Noted   No Resolved Ambulatory Problems   Past Medical History:  Diagnosis Date   Diabetes mellitus without complication (HCC)    Hypercholesteremia    Hypertension    MI (myocardial infarction) (HCC)       Review of Systems  Constitutional:  Negative for fever, malaise/fatigue and weight loss.  HENT:   Positive for congestion.   Respiratory:  Negative for cough and shortness of breath.   Cardiovascular:  Negative for chest pain and palpitations.      Objective:     BP 133/85   Pulse 75   Temp 98.5 F (36.9 C) (Oral)   Ht 5\' 8"  (1.727 m)   Wt 209 lb (94.8 kg)   SpO2 97%   BMI 31.78 kg/m  BP Readings from Last 3 Encounters:  08/23/22 133/85  05/17/22 133/70  02/12/22 (!) 155/76   Wt Readings from Last 3 Encounters:  08/23/22 209 lb (94.8 kg)  02/12/22 209 lb (94.8 kg)  02/08/22 202 lb (91.6 kg)      Physical Exam Cardiovascular:     Rate and Rhythm: Normal rate and regular rhythm.  Pulmonary:     Effort: Pulmonary effort is normal.     Breath sounds: Normal breath sounds.  Neurological:     Mental Status: He is alert.      Results for orders placed or performed in visit on 08/23/22  POCT glycosylated hemoglobin (Hb A1C)  Result Value Ref Range   Hemoglobin A1C 6.5 (A) 4.0 - 5.6 %   HbA1c POC (<> result, manual entry)     HbA1c, POC (prediabetic range)     HbA1c, POC (controlled diabetic range)         Assessment & Plan:  [name] KitchenMarland KitchenLapatrick was seen today for follow-up and diabetes.  Diagnoses and all orders for this visit:  Controlled type 2 diabetes mellitus with diabetic  nephropathy, without long-term current use of insulin (HCC) -     POCT glycosylated hemoglobin (Hb A1C) -     metFORMIN (GLUCOPHAGE) 1000 MG tablet; Take 1 tablet (1,000 mg total) by mouth 2 (two) times daily with a meal. -     glimepiride (AMARYL) 4 MG tablet; Take 1 tablet (4 mg total) by mouth 2 (two) times daily.  Current smoker  Microalbuminuria -     lisinopril (ZESTRIL) 2.5 MG tablet; Take 1 tablet (2.5 mg total) by mouth daily.  Elevated blood pressure reading -     lisinopril (ZESTRIL) 2.5 MG tablet; Take 1 tablet (2.5 mg total) by mouth daily.  Dyslipidemia, goal LDL below 70     A1C 6.5% today and rising Took some time to discuss diabetic diet and printed some  resources Continue same medications.  BP to goal.on ACE.  Continue walking and maintaining a healthy diet. Declines all vaccines due to not having insurance Declines eye exam.  Follow up in 3 months.  Discussed smoking cessation and cutting back on cigarettes.        Tandy Gaw, PA-C

## 2022-08-23 NOTE — Patient Instructions (Signed)

## 2022-10-02 DIAGNOSIS — T07XXXA Unspecified multiple injuries, initial encounter: Secondary | ICD-10-CM | POA: Insufficient documentation

## 2022-10-02 DIAGNOSIS — S42292A Other displaced fracture of upper end of left humerus, initial encounter for closed fracture: Secondary | ICD-10-CM | POA: Insufficient documentation

## 2022-10-02 DIAGNOSIS — T1490XA Injury, unspecified, initial encounter: Secondary | ICD-10-CM | POA: Insufficient documentation

## 2022-10-02 DIAGNOSIS — S42115D Nondisplaced fracture of body of scapula, left shoulder, subsequent encounter for fracture with routine healing: Secondary | ICD-10-CM | POA: Insufficient documentation

## 2022-10-07 DIAGNOSIS — S22079A Unspecified fracture of T9-T10 vertebra, initial encounter for closed fracture: Secondary | ICD-10-CM | POA: Diagnosis not present

## 2022-10-07 DIAGNOSIS — S22069A Unspecified fracture of T7-T8 vertebra, initial encounter for closed fracture: Secondary | ICD-10-CM | POA: Diagnosis not present

## 2022-10-07 DIAGNOSIS — S3600XA Unspecified injury of spleen, initial encounter: Secondary | ICD-10-CM | POA: Diagnosis not present

## 2022-10-07 DIAGNOSIS — S2243XA Multiple fractures of ribs, bilateral, initial encounter for closed fracture: Secondary | ICD-10-CM | POA: Diagnosis not present

## 2022-10-07 DIAGNOSIS — S22059A Unspecified fracture of T5-T6 vertebra, initial encounter for closed fracture: Secondary | ICD-10-CM | POA: Diagnosis not present

## 2022-10-07 DIAGNOSIS — S2242XA Multiple fractures of ribs, left side, initial encounter for closed fracture: Secondary | ICD-10-CM | POA: Diagnosis not present

## 2022-10-07 DIAGNOSIS — S42302A Unspecified fracture of shaft of humerus, left arm, initial encounter for closed fracture: Secondary | ICD-10-CM | POA: Diagnosis not present

## 2022-10-07 DIAGNOSIS — S12500A Unspecified displaced fracture of sixth cervical vertebra, initial encounter for closed fracture: Secondary | ICD-10-CM | POA: Diagnosis not present

## 2022-10-07 DIAGNOSIS — J9602 Acute respiratory failure with hypercapnia: Secondary | ICD-10-CM | POA: Diagnosis not present

## 2022-10-07 DIAGNOSIS — J9601 Acute respiratory failure with hypoxia: Secondary | ICD-10-CM | POA: Diagnosis not present

## 2022-10-07 DIAGNOSIS — Z4659 Encounter for fitting and adjustment of other gastrointestinal appliance and device: Secondary | ICD-10-CM | POA: Diagnosis not present

## 2022-10-07 DIAGNOSIS — S42102A Fracture of unspecified part of scapula, left shoulder, initial encounter for closed fracture: Secondary | ICD-10-CM | POA: Diagnosis not present

## 2022-10-07 DIAGNOSIS — S12600A Unspecified displaced fracture of seventh cervical vertebra, initial encounter for closed fracture: Secondary | ICD-10-CM | POA: Diagnosis not present

## 2022-10-08 DIAGNOSIS — H93232 Hyperacusis, left ear: Secondary | ICD-10-CM | POA: Insufficient documentation

## 2022-10-08 DIAGNOSIS — J9601 Acute respiratory failure with hypoxia: Secondary | ICD-10-CM | POA: Diagnosis not present

## 2022-10-08 DIAGNOSIS — J9602 Acute respiratory failure with hypercapnia: Secondary | ICD-10-CM | POA: Diagnosis not present

## 2022-10-09 DIAGNOSIS — S2242XA Multiple fractures of ribs, left side, initial encounter for closed fracture: Secondary | ICD-10-CM | POA: Diagnosis not present

## 2022-10-09 DIAGNOSIS — S22079A Unspecified fracture of T9-T10 vertebra, initial encounter for closed fracture: Secondary | ICD-10-CM | POA: Diagnosis not present

## 2022-10-09 DIAGNOSIS — I472 Ventricular tachycardia, unspecified: Secondary | ICD-10-CM | POA: Diagnosis not present

## 2022-10-09 DIAGNOSIS — S20212A Contusion of left front wall of thorax, initial encounter: Secondary | ICD-10-CM | POA: Diagnosis not present

## 2022-10-09 DIAGNOSIS — S22069A Unspecified fracture of T7-T8 vertebra, initial encounter for closed fracture: Secondary | ICD-10-CM | POA: Diagnosis not present

## 2022-10-09 DIAGNOSIS — S12500A Unspecified displaced fracture of sixth cervical vertebra, initial encounter for closed fracture: Secondary | ICD-10-CM | POA: Diagnosis not present

## 2022-10-09 DIAGNOSIS — R4182 Altered mental status, unspecified: Secondary | ICD-10-CM | POA: Diagnosis not present

## 2022-10-09 DIAGNOSIS — J90 Pleural effusion, not elsewhere classified: Secondary | ICD-10-CM | POA: Diagnosis not present

## 2022-10-09 DIAGNOSIS — S22059A Unspecified fracture of T5-T6 vertebra, initial encounter for closed fracture: Secondary | ICD-10-CM | POA: Diagnosis not present

## 2022-10-09 DIAGNOSIS — S42302A Unspecified fracture of shaft of humerus, left arm, initial encounter for closed fracture: Secondary | ICD-10-CM | POA: Diagnosis not present

## 2022-10-09 DIAGNOSIS — S0240EA Zygomatic fracture, right side, initial encounter for closed fracture: Secondary | ICD-10-CM | POA: Diagnosis not present

## 2022-10-09 DIAGNOSIS — S42102A Fracture of unspecified part of scapula, left shoulder, initial encounter for closed fracture: Secondary | ICD-10-CM | POA: Diagnosis not present

## 2022-10-09 DIAGNOSIS — Z9081 Acquired absence of spleen: Secondary | ICD-10-CM | POA: Diagnosis not present

## 2022-10-09 DIAGNOSIS — S12600A Unspecified displaced fracture of seventh cervical vertebra, initial encounter for closed fracture: Secondary | ICD-10-CM | POA: Diagnosis not present

## 2022-10-09 DIAGNOSIS — R9389 Abnormal findings on diagnostic imaging of other specified body structures: Secondary | ICD-10-CM | POA: Diagnosis not present

## 2022-10-09 DIAGNOSIS — J9819 Other pulmonary collapse: Secondary | ICD-10-CM | POA: Diagnosis not present

## 2022-10-09 DIAGNOSIS — R918 Other nonspecific abnormal finding of lung field: Secondary | ICD-10-CM | POA: Diagnosis not present

## 2022-10-09 DIAGNOSIS — R001 Bradycardia, unspecified: Secondary | ICD-10-CM | POA: Diagnosis not present

## 2022-10-09 DIAGNOSIS — I491 Atrial premature depolarization: Secondary | ICD-10-CM | POA: Diagnosis not present

## 2022-10-09 DIAGNOSIS — J9601 Acute respiratory failure with hypoxia: Secondary | ICD-10-CM | POA: Diagnosis not present

## 2022-10-10 DIAGNOSIS — Z9081 Acquired absence of spleen: Secondary | ICD-10-CM | POA: Diagnosis not present

## 2022-10-10 DIAGNOSIS — R0602 Shortness of breath: Secondary | ICD-10-CM | POA: Diagnosis not present

## 2022-10-10 DIAGNOSIS — S0240EA Zygomatic fracture, right side, initial encounter for closed fracture: Secondary | ICD-10-CM | POA: Diagnosis not present

## 2022-10-10 DIAGNOSIS — S42302A Unspecified fracture of shaft of humerus, left arm, initial encounter for closed fracture: Secondary | ICD-10-CM | POA: Diagnosis not present

## 2022-10-10 DIAGNOSIS — R4182 Altered mental status, unspecified: Secondary | ICD-10-CM | POA: Diagnosis not present

## 2022-10-10 DIAGNOSIS — Z4682 Encounter for fitting and adjustment of non-vascular catheter: Secondary | ICD-10-CM | POA: Diagnosis not present

## 2022-10-10 DIAGNOSIS — J9601 Acute respiratory failure with hypoxia: Secondary | ICD-10-CM | POA: Diagnosis not present

## 2022-10-10 DIAGNOSIS — S270XXA Traumatic pneumothorax, initial encounter: Secondary | ICD-10-CM | POA: Diagnosis not present

## 2022-10-10 DIAGNOSIS — S12500A Unspecified displaced fracture of sixth cervical vertebra, initial encounter for closed fracture: Secondary | ICD-10-CM | POA: Diagnosis not present

## 2022-10-10 DIAGNOSIS — S12600A Unspecified displaced fracture of seventh cervical vertebra, initial encounter for closed fracture: Secondary | ICD-10-CM | POA: Diagnosis not present

## 2022-10-10 DIAGNOSIS — S22069A Unspecified fracture of T7-T8 vertebra, initial encounter for closed fracture: Secondary | ICD-10-CM | POA: Diagnosis not present

## 2022-10-10 DIAGNOSIS — S22059A Unspecified fracture of T5-T6 vertebra, initial encounter for closed fracture: Secondary | ICD-10-CM | POA: Diagnosis not present

## 2022-10-10 DIAGNOSIS — S22079A Unspecified fracture of T9-T10 vertebra, initial encounter for closed fracture: Secondary | ICD-10-CM | POA: Diagnosis not present

## 2022-10-10 DIAGNOSIS — S2242XA Multiple fractures of ribs, left side, initial encounter for closed fracture: Secondary | ICD-10-CM | POA: Diagnosis not present

## 2022-10-10 DIAGNOSIS — S42102A Fracture of unspecified part of scapula, left shoulder, initial encounter for closed fracture: Secondary | ICD-10-CM | POA: Diagnosis not present

## 2022-10-11 DIAGNOSIS — S2242XA Multiple fractures of ribs, left side, initial encounter for closed fracture: Secondary | ICD-10-CM | POA: Diagnosis not present

## 2022-10-11 DIAGNOSIS — I472 Ventricular tachycardia, unspecified: Secondary | ICD-10-CM | POA: Diagnosis not present

## 2022-10-11 DIAGNOSIS — I491 Atrial premature depolarization: Secondary | ICD-10-CM | POA: Diagnosis not present

## 2022-10-11 DIAGNOSIS — S2243XA Multiple fractures of ribs, bilateral, initial encounter for closed fracture: Secondary | ICD-10-CM | POA: Diagnosis not present

## 2022-10-11 DIAGNOSIS — J9811 Atelectasis: Secondary | ICD-10-CM | POA: Diagnosis not present

## 2022-10-11 DIAGNOSIS — J90 Pleural effusion, not elsewhere classified: Secondary | ICD-10-CM | POA: Diagnosis not present

## 2022-10-11 DIAGNOSIS — R001 Bradycardia, unspecified: Secondary | ICD-10-CM | POA: Diagnosis not present

## 2022-10-15 DIAGNOSIS — S12600A Unspecified displaced fracture of seventh cervical vertebra, initial encounter for closed fracture: Secondary | ICD-10-CM | POA: Diagnosis not present

## 2022-10-15 DIAGNOSIS — S42102A Fracture of unspecified part of scapula, left shoulder, initial encounter for closed fracture: Secondary | ICD-10-CM | POA: Diagnosis not present

## 2022-10-15 DIAGNOSIS — S22069A Unspecified fracture of T7-T8 vertebra, initial encounter for closed fracture: Secondary | ICD-10-CM | POA: Diagnosis not present

## 2022-10-15 DIAGNOSIS — S22059A Unspecified fracture of T5-T6 vertebra, initial encounter for closed fracture: Secondary | ICD-10-CM | POA: Diagnosis not present

## 2022-10-15 DIAGNOSIS — S42302A Unspecified fracture of shaft of humerus, left arm, initial encounter for closed fracture: Secondary | ICD-10-CM | POA: Diagnosis not present

## 2022-10-15 DIAGNOSIS — Z9081 Acquired absence of spleen: Secondary | ICD-10-CM | POA: Diagnosis not present

## 2022-10-15 DIAGNOSIS — S2242XA Multiple fractures of ribs, left side, initial encounter for closed fracture: Secondary | ICD-10-CM | POA: Diagnosis not present

## 2022-10-15 DIAGNOSIS — S12500A Unspecified displaced fracture of sixth cervical vertebra, initial encounter for closed fracture: Secondary | ICD-10-CM | POA: Diagnosis not present

## 2022-10-15 DIAGNOSIS — G4700 Insomnia, unspecified: Secondary | ICD-10-CM | POA: Diagnosis not present

## 2022-10-15 DIAGNOSIS — F419 Anxiety disorder, unspecified: Secondary | ICD-10-CM | POA: Diagnosis not present

## 2022-10-15 DIAGNOSIS — S22079A Unspecified fracture of T9-T10 vertebra, initial encounter for closed fracture: Secondary | ICD-10-CM | POA: Diagnosis not present

## 2022-10-15 DIAGNOSIS — R451 Restlessness and agitation: Secondary | ICD-10-CM | POA: Diagnosis not present

## 2022-10-16 DIAGNOSIS — Z9081 Acquired absence of spleen: Secondary | ICD-10-CM | POA: Diagnosis not present

## 2022-10-16 DIAGNOSIS — S12600A Unspecified displaced fracture of seventh cervical vertebra, initial encounter for closed fracture: Secondary | ICD-10-CM | POA: Diagnosis not present

## 2022-10-16 DIAGNOSIS — S22059A Unspecified fracture of T5-T6 vertebra, initial encounter for closed fracture: Secondary | ICD-10-CM | POA: Diagnosis not present

## 2022-10-16 DIAGNOSIS — S22069A Unspecified fracture of T7-T8 vertebra, initial encounter for closed fracture: Secondary | ICD-10-CM | POA: Diagnosis not present

## 2022-10-16 DIAGNOSIS — G4700 Insomnia, unspecified: Secondary | ICD-10-CM | POA: Diagnosis not present

## 2022-10-16 DIAGNOSIS — R451 Restlessness and agitation: Secondary | ICD-10-CM | POA: Diagnosis not present

## 2022-10-16 DIAGNOSIS — S42102A Fracture of unspecified part of scapula, left shoulder, initial encounter for closed fracture: Secondary | ICD-10-CM | POA: Diagnosis not present

## 2022-10-16 DIAGNOSIS — F419 Anxiety disorder, unspecified: Secondary | ICD-10-CM | POA: Diagnosis not present

## 2022-10-16 DIAGNOSIS — S12500A Unspecified displaced fracture of sixth cervical vertebra, initial encounter for closed fracture: Secondary | ICD-10-CM | POA: Diagnosis not present

## 2022-10-16 DIAGNOSIS — S2242XA Multiple fractures of ribs, left side, initial encounter for closed fracture: Secondary | ICD-10-CM | POA: Diagnosis not present

## 2022-10-16 DIAGNOSIS — S42302A Unspecified fracture of shaft of humerus, left arm, initial encounter for closed fracture: Secondary | ICD-10-CM | POA: Diagnosis not present

## 2022-10-16 DIAGNOSIS — S22079A Unspecified fracture of T9-T10 vertebra, initial encounter for closed fracture: Secondary | ICD-10-CM | POA: Diagnosis not present

## 2022-10-18 ENCOUNTER — Telehealth: Payer: Self-pay | Admitting: General Practice
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
## 2022-10-20 DIAGNOSIS — I498 Other specified cardiac arrhythmias: Secondary | ICD-10-CM | POA: Diagnosis not present

## 2022-10-21 ENCOUNTER — Telehealth: Payer: Self-pay

## 2022-10-21 NOTE — Telephone Encounter (Signed)
Patient called with concern of testing before his appointment 10/25/2022. Patient wants to ensure if he has blood clots. Patient was inform all medical concerns wit be addressed in visit. Patient is advised

## 2022-10-25 ENCOUNTER — Encounter: Payer: Self-pay | Admitting: Physician Assistant

## 2022-10-25 ENCOUNTER — Ambulatory Visit (INDEPENDENT_AMBULATORY_CARE_PROVIDER_SITE_OTHER): Payer: Medicaid Other | Admitting: Physician Assistant

## 2022-10-25 DIAGNOSIS — Z7901 Long term (current) use of anticoagulants: Secondary | ICD-10-CM | POA: Diagnosis not present

## 2022-10-25 DIAGNOSIS — S42102D Fracture of unspecified part of scapula, left shoulder, subsequent encounter for fracture with routine healing: Secondary | ICD-10-CM | POA: Diagnosis not present

## 2022-10-25 DIAGNOSIS — Z5181 Encounter for therapeutic drug level monitoring: Secondary | ICD-10-CM | POA: Diagnosis not present

## 2022-10-25 DIAGNOSIS — S42292A Other displaced fracture of upper end of left humerus, initial encounter for closed fracture: Secondary | ICD-10-CM | POA: Diagnosis not present

## 2022-10-25 DIAGNOSIS — S22059D Unspecified fracture of T5-T6 vertebra, subsequent encounter for fracture with routine healing: Secondary | ICD-10-CM | POA: Diagnosis not present

## 2022-10-25 DIAGNOSIS — S12600A Unspecified displaced fracture of seventh cervical vertebra, initial encounter for closed fracture: Secondary | ICD-10-CM | POA: Diagnosis not present

## 2022-10-25 DIAGNOSIS — S12601D Unspecified nondisplaced fracture of seventh cervical vertebra, subsequent encounter for fracture with routine healing: Secondary | ICD-10-CM | POA: Diagnosis not present

## 2022-10-25 DIAGNOSIS — S22008D Other fracture of unspecified thoracic vertebra, subsequent encounter for fracture with routine healing: Secondary | ICD-10-CM

## 2022-10-25 DIAGNOSIS — I2699 Other pulmonary embolism without acute cor pulmonale: Secondary | ICD-10-CM | POA: Insufficient documentation

## 2022-10-25 DIAGNOSIS — S22069D Unspecified fracture of T7-T8 vertebra, subsequent encounter for fracture with routine healing: Secondary | ICD-10-CM | POA: Diagnosis not present

## 2022-10-25 DIAGNOSIS — S42202D Unspecified fracture of upper end of left humerus, subsequent encounter for fracture with routine healing: Secondary | ICD-10-CM | POA: Diagnosis not present

## 2022-10-25 DIAGNOSIS — S2243XD Multiple fractures of ribs, bilateral, subsequent encounter for fracture with routine healing: Secondary | ICD-10-CM

## 2022-10-25 DIAGNOSIS — Z79899 Other long term (current) drug therapy: Secondary | ICD-10-CM | POA: Diagnosis not present

## 2022-10-25 DIAGNOSIS — S12500A Unspecified displaced fracture of sixth cervical vertebra, initial encounter for closed fracture: Secondary | ICD-10-CM | POA: Diagnosis not present

## 2022-10-25 DIAGNOSIS — M50322 Other cervical disc degeneration at C5-C6 level: Secondary | ICD-10-CM | POA: Diagnosis not present

## 2022-10-25 DIAGNOSIS — S42112D Displaced fracture of body of scapula, left shoulder, subsequent encounter for fracture with routine healing: Secondary | ICD-10-CM | POA: Diagnosis not present

## 2022-10-25 DIAGNOSIS — S22079D Unspecified fracture of T9-T10 vertebra, subsequent encounter for fracture with routine healing: Secondary | ICD-10-CM | POA: Diagnosis not present

## 2022-10-25 DIAGNOSIS — M4312 Spondylolisthesis, cervical region: Secondary | ICD-10-CM | POA: Diagnosis not present

## 2022-10-25 DIAGNOSIS — M79675 Pain in left toe(s): Secondary | ICD-10-CM | POA: Diagnosis not present

## 2022-10-25 DIAGNOSIS — S12501D Unspecified nondisplaced fracture of sixth cervical vertebra, subsequent encounter for fracture with routine healing: Secondary | ICD-10-CM | POA: Diagnosis not present

## 2022-10-25 IMAGING — DX DG RIBS W/ CHEST 3+V*L*
4 series · 4 of 4 positions shown · non-contrast
Comparison: Chest radiograph January 08, 2017

CLINICAL DATA: Pain following fall

EXAM:
LEFT RIBS AND CHEST - 3+ VIEW

[chest pa]
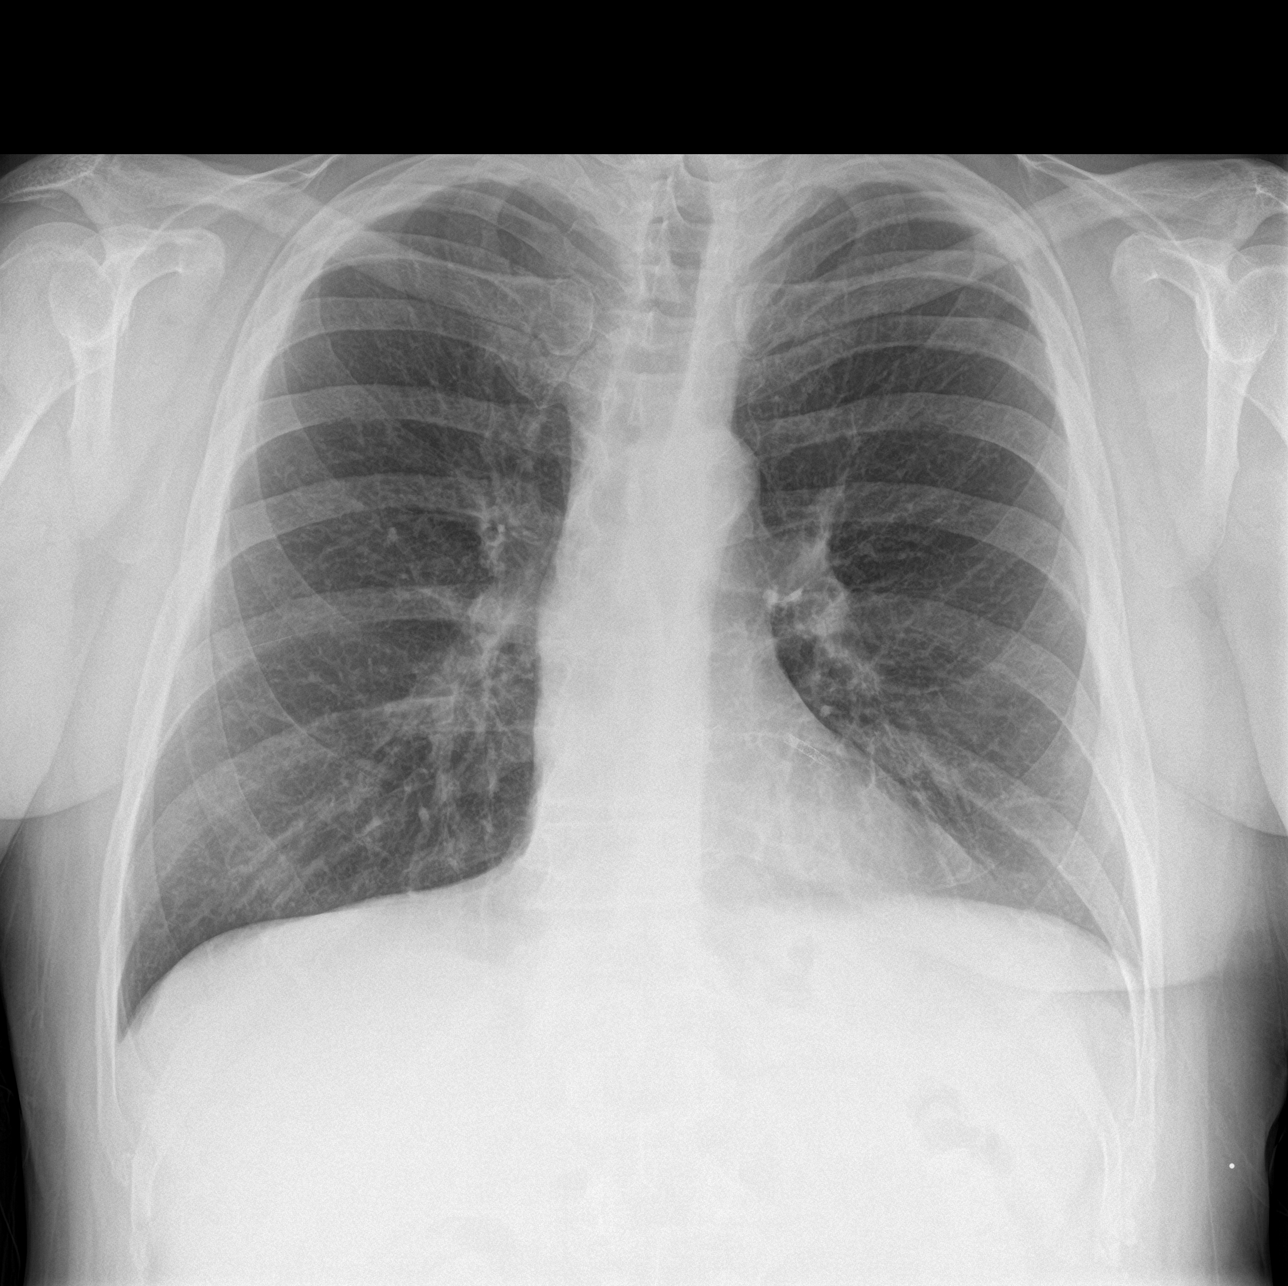

[rib pa (1 of 3)]
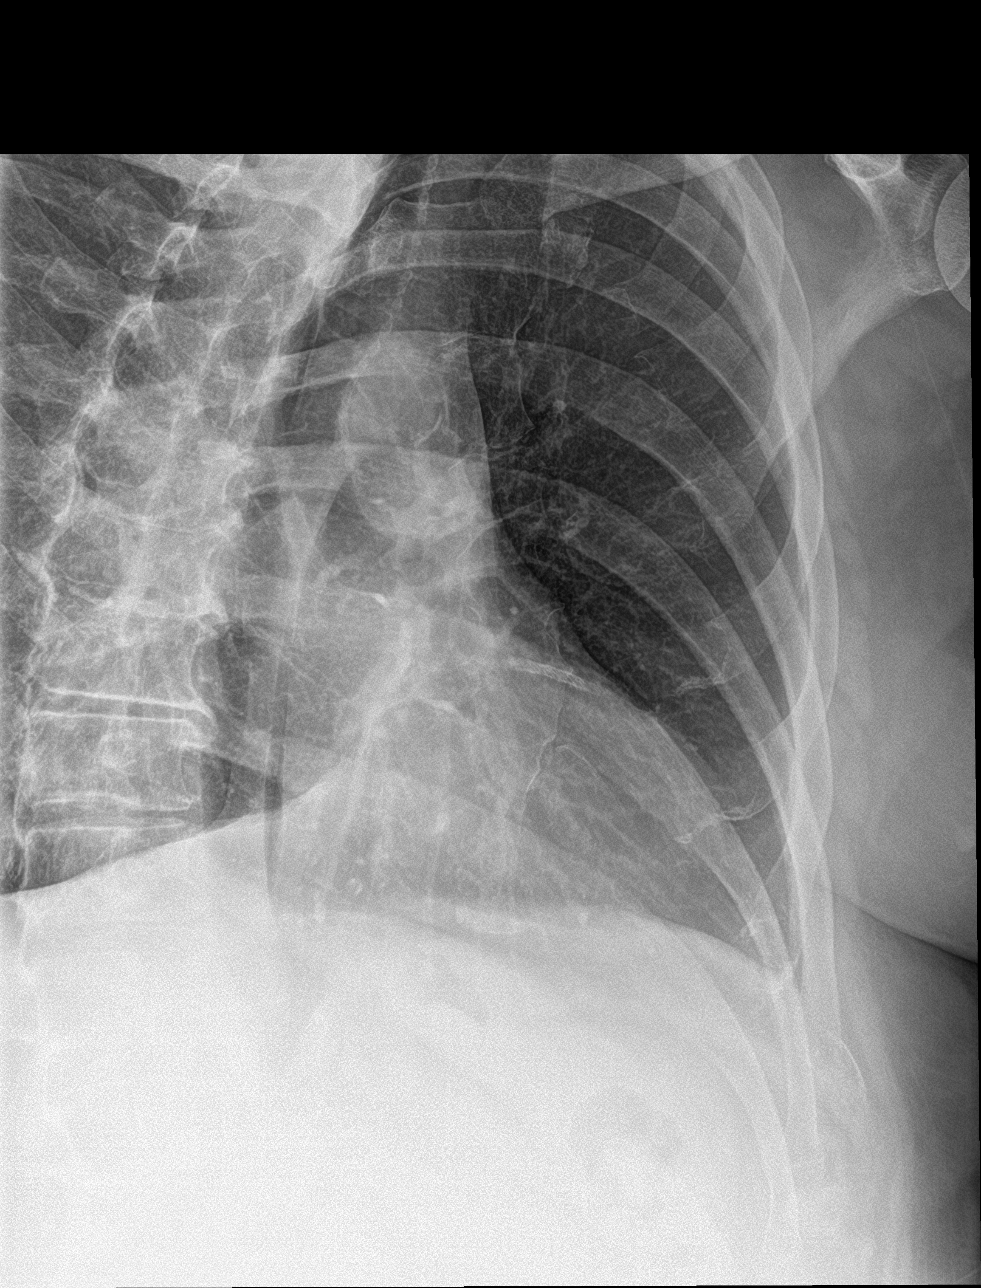

[rib pa (2 of 3)]
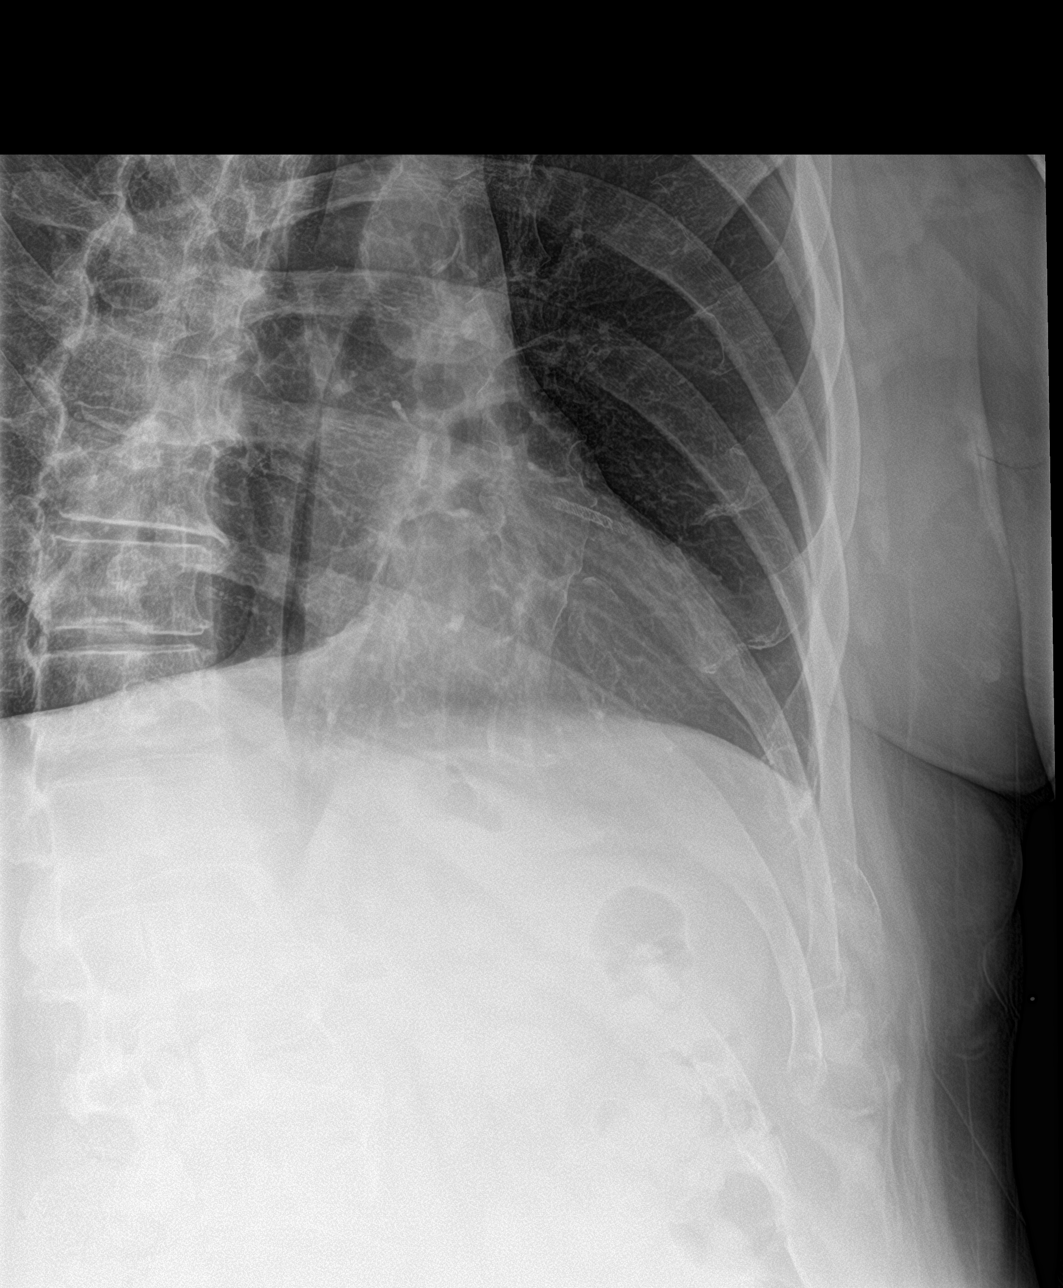

[rib pa (3 of 3)]
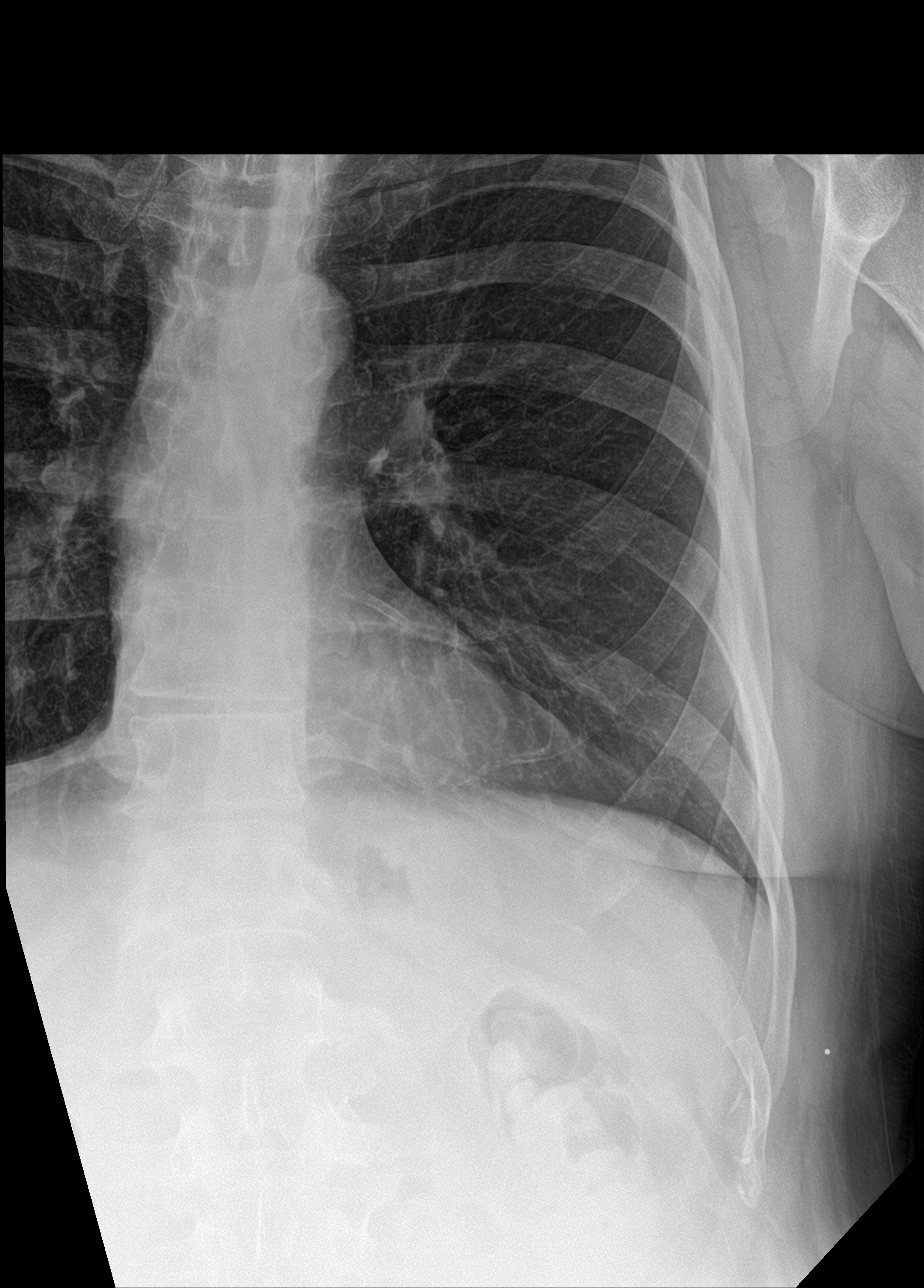

[4 of 4 positions shown; findings below may reference images not displayed]

FINDINGS: Frontal chest as well as oblique and cone-down rib images were
obtained. There is slight left base atelectasis. Lungs elsewhere are
clear. Heart size and pulmonary vascularity are normal. No
adenopathy. There is an apparent coronary stent on the left.

No appreciable pneumothorax or pleural effusion. There is a fracture
of the anterior left seventh rib with angulation at the fracture
site. No other fracture evident.
IMPRESSION: Fracture anterior left seventh rib. No pneumothorax or pleural
effusion. No edema or airspace opacity. Mild atelectasis left base.
Heart size within normal limits.

## 2022-10-25 MED ORDER — HYDROCODONE-ACETAMINOPHEN 5-325 MG PO TABS: 2.0000 | ORAL_TABLET | Freq: Four times a day (QID) | ORAL | 0 refills | Status: DC | PRN
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
## 2022-10-26 LAB — COMPLETE METABOLIC PANEL WITH GFR
AG Ratio: 1.1 (calc) (ref 1.0–2.5)
ALT: 49 U/L — ABNORMAL HIGH (ref 9–46)
AST: 30 U/L (ref 10–35)
Albumin: 3.4 g/dL — ABNORMAL LOW (ref 3.6–5.1)
Alkaline phosphatase (APISO): 179 U/L — ABNORMAL HIGH (ref 35–144)
BUN: 19 mg/dL (ref 7–25)
CO2: 27 mmol/L (ref 20–32)
Calcium: 9 mg/dL (ref 8.6–10.3)
Chloride: 104 mmol/L (ref 98–110)
Creat: 0.71 mg/dL (ref 0.70–1.35)
Globulin: 3.2 g/dL (calc) (ref 1.9–3.7)
Glucose, Bld: 168 mg/dL — ABNORMAL HIGH (ref 65–99)
Potassium: 4.3 mmol/L (ref 3.5–5.3)
Sodium: 139 mmol/L (ref 135–146)
Total Bilirubin: 0.4 mg/dL (ref 0.2–1.2)
Total Protein: 6.6 g/dL (ref 6.1–8.1)
eGFR: 104 mL/min/{1.73_m2} (ref >=60)

## 2022-10-26 LAB — CP4508-PT/INR AND PTT
INR: 3.4 — ABNORMAL HIGH
Prothrombin Time: 33.4 s — ABNORMAL HIGH (ref 9.0–11.5)
aPTT: 53 s — ABNORMAL HIGH (ref 23–32)

## 2022-10-28 ENCOUNTER — Telehealth: Payer: Self-pay

## 2022-10-28 ENCOUNTER — Encounter: Payer: Self-pay | Admitting: Physician Assistant

## 2022-10-28 DIAGNOSIS — Z5181 Encounter for therapeutic drug level monitoring: Secondary | ICD-10-CM | POA: Insufficient documentation

## 2022-10-28 DIAGNOSIS — S22008D Other fracture of unspecified thoracic vertebra, subsequent encounter for fracture with routine healing: Secondary | ICD-10-CM | POA: Insufficient documentation

## 2022-10-28 DIAGNOSIS — S2243XA Multiple fractures of ribs, bilateral, initial encounter for closed fracture: Secondary | ICD-10-CM | POA: Insufficient documentation

## 2022-10-28 MED ORDER — WARFARIN SODIUM 5 MG PO TABS: ORAL_TABLET | ORAL | 0 refills | Status: DC
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
## 2022-10-30 ENCOUNTER — Telehealth: Payer: Self-pay | Admitting: Neurology

## 2022-10-30 MED ORDER — CYCLOBENZAPRINE HCL 10 MG PO TABS: 10.0000 mg | ORAL_TABLET | Freq: Three times a day (TID) | ORAL | 0 refills | Status: DC | PRN

## 2022-10-30 NOTE — Telephone Encounter (Signed)
Patient made aware.   He is also concerned. He has had swelling in both feet since he was in the hospital (3-4 days before discharge) and it has not improved. He has tried to walk more, soaking feet in epsom salt and using ice with no relief. Swelling only in feet and not in legs/ankles.   I did advise elevation and compression stockings to help. Please advise if he needs to do anything different.

## 2022-10-30 NOTE — Telephone Encounter (Signed)
I sent flexeril, muscle relaxer, to pharmacy. He is correct he cannot take any NSAIDs on coumadin.

## 2022-10-30 NOTE — Telephone Encounter (Signed)
Patient called and LVM stating after seeing orthopedics they removed his collar and neck/ribs are better. He is still having a lot of shoulder pain. He is using ice and hydrocodone. He feels like hydrocodone is not helping, but he has been told he can not use ibuprofen/advil/NSAIDS due to blood thinner.   He is asking for some type of safe anti-inflammatory or muscle relaxer to help with the pain at night. Please advise.

## 2022-11-01 DIAGNOSIS — S42102D Fracture of unspecified part of scapula, left shoulder, subsequent encounter for fracture with routine healing: Secondary | ICD-10-CM | POA: Diagnosis not present

## 2022-11-01 DIAGNOSIS — M25512 Pain in left shoulder: Secondary | ICD-10-CM | POA: Diagnosis not present

## 2022-11-01 DIAGNOSIS — S42012D Anterior displaced fracture of sternal end of left clavicle, subsequent encounter for fracture with routine healing: Secondary | ICD-10-CM | POA: Diagnosis not present

## 2022-11-01 DIAGNOSIS — S42202D Unspecified fracture of upper end of left humerus, subsequent encounter for fracture with routine healing: Secondary | ICD-10-CM | POA: Diagnosis not present

## 2022-11-01 NOTE — Telephone Encounter (Signed)
Patient advised.

## 2022-11-01 NOTE — Telephone Encounter (Signed)
It is fairly common to bruise on coumadin very easily. We will be checking your INR very soon at follow up.
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
## 2022-11-05 ENCOUNTER — Telehealth: Payer: Self-pay

## 2022-11-05 DIAGNOSIS — Z87891 Personal history of nicotine dependence: Secondary | ICD-10-CM | POA: Diagnosis not present

## 2022-11-05 DIAGNOSIS — J90 Pleural effusion, not elsewhere classified: Secondary | ICD-10-CM | POA: Diagnosis not present

## 2022-11-05 DIAGNOSIS — J942 Hemothorax: Secondary | ICD-10-CM | POA: Diagnosis not present

## 2022-11-05 DIAGNOSIS — D62 Acute posthemorrhagic anemia: Secondary | ICD-10-CM | POA: Diagnosis not present

## 2022-11-05 DIAGNOSIS — Z9081 Acquired absence of spleen: Secondary | ICD-10-CM | POA: Diagnosis not present

## 2022-11-05 DIAGNOSIS — R0602 Shortness of breath: Secondary | ICD-10-CM | POA: Diagnosis not present

## 2022-11-05 DIAGNOSIS — J811 Chronic pulmonary edema: Secondary | ICD-10-CM | POA: Diagnosis not present

## 2022-11-05 DIAGNOSIS — R0789 Other chest pain: Secondary | ICD-10-CM | POA: Diagnosis not present

## 2022-11-05 DIAGNOSIS — R072 Precordial pain: Secondary | ICD-10-CM | POA: Diagnosis not present

## 2022-11-05 DIAGNOSIS — Z978 Presence of other specified devices: Secondary | ICD-10-CM | POA: Diagnosis not present

## 2022-11-05 DIAGNOSIS — R079 Chest pain, unspecified: Secondary | ICD-10-CM | POA: Diagnosis not present

## 2022-11-05 DIAGNOSIS — K8020 Calculus of gallbladder without cholecystitis without obstruction: Secondary | ICD-10-CM | POA: Diagnosis not present

## 2022-11-05 DIAGNOSIS — R531 Weakness: Secondary | ICD-10-CM | POA: Diagnosis not present

## 2022-11-05 DIAGNOSIS — R5383 Other fatigue: Secondary | ICD-10-CM | POA: Diagnosis not present

## 2022-11-05 DIAGNOSIS — I517 Cardiomegaly: Secondary | ICD-10-CM | POA: Diagnosis not present

## 2022-11-05 DIAGNOSIS — I959 Hypotension, unspecified: Secondary | ICD-10-CM | POA: Diagnosis not present

## 2022-11-05 NOTE — Telephone Encounter (Signed)
Averill's wife called and left a message for a return call. I called her and she states he started to have sweats and confusion. She called EMS and they are taking him to the ED.

## 2022-11-05 NOTE — Telephone Encounter (Signed)
Agree with above plan.

## 2022-11-06 DIAGNOSIS — Z978 Presence of other specified devices: Secondary | ICD-10-CM | POA: Diagnosis not present

## 2022-11-06 DIAGNOSIS — E1169 Type 2 diabetes mellitus with other specified complication: Secondary | ICD-10-CM | POA: Diagnosis not present

## 2022-11-06 DIAGNOSIS — J90 Pleural effusion, not elsewhere classified: Secondary | ICD-10-CM | POA: Diagnosis not present

## 2022-11-06 DIAGNOSIS — E785 Hyperlipidemia, unspecified: Secondary | ICD-10-CM | POA: Diagnosis not present

## 2022-11-06 DIAGNOSIS — J939 Pneumothorax, unspecified: Secondary | ICD-10-CM | POA: Diagnosis not present

## 2022-11-06 DIAGNOSIS — J942 Hemothorax: Secondary | ICD-10-CM | POA: Diagnosis not present

## 2022-11-07 ENCOUNTER — Telehealth: Payer: Self-pay | Admitting: Neurology

## 2022-11-07 DIAGNOSIS — E785 Hyperlipidemia, unspecified: Secondary | ICD-10-CM | POA: Diagnosis not present

## 2022-11-07 DIAGNOSIS — J939 Pneumothorax, unspecified: Secondary | ICD-10-CM | POA: Diagnosis not present

## 2022-11-07 DIAGNOSIS — I2510 Atherosclerotic heart disease of native coronary artery without angina pectoris: Secondary | ICD-10-CM | POA: Diagnosis not present

## 2022-11-07 DIAGNOSIS — J942 Hemothorax: Secondary | ICD-10-CM | POA: Diagnosis not present

## 2022-11-07 DIAGNOSIS — R918 Other nonspecific abnormal finding of lung field: Secondary | ICD-10-CM | POA: Diagnosis not present

## 2022-11-07 DIAGNOSIS — E1169 Type 2 diabetes mellitus with other specified complication: Secondary | ICD-10-CM | POA: Diagnosis not present

## 2022-11-07 DIAGNOSIS — I2699 Other pulmonary embolism without acute cor pulmonale: Secondary | ICD-10-CM | POA: Diagnosis not present

## 2022-11-07 DIAGNOSIS — I517 Cardiomegaly: Secondary | ICD-10-CM | POA: Diagnosis not present

## 2022-11-07 DIAGNOSIS — J90 Pleural effusion, not elsewhere classified: Secondary | ICD-10-CM | POA: Diagnosis not present
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
## 2022-11-08 ENCOUNTER — Ambulatory Visit: Payer: Self-pay | Admitting: Physician Assistant

## 2022-11-08 DIAGNOSIS — J942 Hemothorax: Secondary | ICD-10-CM | POA: Diagnosis not present

## 2022-11-08 DIAGNOSIS — J939 Pneumothorax, unspecified: Secondary | ICD-10-CM | POA: Diagnosis not present

## 2022-11-08 DIAGNOSIS — J90 Pleural effusion, not elsewhere classified: Secondary | ICD-10-CM | POA: Diagnosis not present

## 2022-11-08 DIAGNOSIS — I2510 Atherosclerotic heart disease of native coronary artery without angina pectoris: Secondary | ICD-10-CM | POA: Diagnosis not present

## 2022-11-08 DIAGNOSIS — E1169 Type 2 diabetes mellitus with other specified complication: Secondary | ICD-10-CM | POA: Diagnosis not present

## 2022-11-08 DIAGNOSIS — I10 Essential (primary) hypertension: Secondary | ICD-10-CM | POA: Diagnosis not present

## 2022-11-08 DIAGNOSIS — I255 Ischemic cardiomyopathy: Secondary | ICD-10-CM | POA: Diagnosis not present

## 2022-11-08 DIAGNOSIS — E785 Hyperlipidemia, unspecified: Secondary | ICD-10-CM | POA: Diagnosis not present

## 2022-11-08 DIAGNOSIS — Z978 Presence of other specified devices: Secondary | ICD-10-CM | POA: Diagnosis not present

## 2022-11-08 DIAGNOSIS — Z48813 Encounter for surgical aftercare following surgery on the respiratory system: Secondary | ICD-10-CM | POA: Diagnosis not present

## 2022-11-08 DIAGNOSIS — D62 Acute posthemorrhagic anemia: Secondary | ICD-10-CM | POA: Diagnosis not present

## 2022-11-08 DIAGNOSIS — E119 Type 2 diabetes mellitus without complications: Secondary | ICD-10-CM | POA: Diagnosis not present

## 2022-11-08 NOTE — Telephone Encounter (Signed)
Thanks for update

## 2022-11-09 DIAGNOSIS — J942 Hemothorax: Secondary | ICD-10-CM | POA: Diagnosis not present

## 2022-11-09 DIAGNOSIS — J90 Pleural effusion, not elsewhere classified: Secondary | ICD-10-CM | POA: Diagnosis not present

## 2022-11-09 DIAGNOSIS — E1169 Type 2 diabetes mellitus with other specified complication: Secondary | ICD-10-CM | POA: Diagnosis not present

## 2022-11-09 DIAGNOSIS — Z978 Presence of other specified devices: Secondary | ICD-10-CM | POA: Diagnosis not present

## 2022-11-09 DIAGNOSIS — J939 Pneumothorax, unspecified: Secondary | ICD-10-CM | POA: Diagnosis not present

## 2022-11-09 DIAGNOSIS — E785 Hyperlipidemia, unspecified: Secondary | ICD-10-CM | POA: Diagnosis not present

## 2022-11-09 DIAGNOSIS — R918 Other nonspecific abnormal finding of lung field: Secondary | ICD-10-CM | POA: Diagnosis not present

## 2022-11-10 DIAGNOSIS — J939 Pneumothorax, unspecified: Secondary | ICD-10-CM | POA: Diagnosis not present

## 2022-11-10 DIAGNOSIS — E785 Hyperlipidemia, unspecified: Secondary | ICD-10-CM | POA: Diagnosis not present

## 2022-11-10 DIAGNOSIS — E1169 Type 2 diabetes mellitus with other specified complication: Secondary | ICD-10-CM | POA: Diagnosis not present

## 2022-11-10 DIAGNOSIS — Z48813 Encounter for surgical aftercare following surgery on the respiratory system: Secondary | ICD-10-CM | POA: Diagnosis not present

## 2022-11-10 DIAGNOSIS — J942 Hemothorax: Secondary | ICD-10-CM | POA: Diagnosis not present

## 2022-11-10 DIAGNOSIS — R918 Other nonspecific abnormal finding of lung field: Secondary | ICD-10-CM | POA: Diagnosis not present

## 2022-11-11 DIAGNOSIS — J942 Hemothorax: Secondary | ICD-10-CM | POA: Diagnosis not present

## 2022-11-11 DIAGNOSIS — E1169 Type 2 diabetes mellitus with other specified complication: Secondary | ICD-10-CM | POA: Diagnosis not present

## 2022-11-11 DIAGNOSIS — J90 Pleural effusion, not elsewhere classified: Secondary | ICD-10-CM | POA: Diagnosis not present

## 2022-11-11 DIAGNOSIS — Z48813 Encounter for surgical aftercare following surgery on the respiratory system: Secondary | ICD-10-CM | POA: Diagnosis not present

## 2022-11-11 DIAGNOSIS — E785 Hyperlipidemia, unspecified: Secondary | ICD-10-CM | POA: Diagnosis not present

## 2022-11-11 DIAGNOSIS — J939 Pneumothorax, unspecified: Secondary | ICD-10-CM | POA: Diagnosis not present

## 2022-11-11 DIAGNOSIS — R918 Other nonspecific abnormal finding of lung field: Secondary | ICD-10-CM | POA: Diagnosis not present

## 2022-11-12 DIAGNOSIS — Z48813 Encounter for surgical aftercare following surgery on the respiratory system: Secondary | ICD-10-CM | POA: Diagnosis not present

## 2022-11-12 DIAGNOSIS — J90 Pleural effusion, not elsewhere classified: Secondary | ICD-10-CM | POA: Diagnosis not present

## 2022-11-12 DIAGNOSIS — R918 Other nonspecific abnormal finding of lung field: Secondary | ICD-10-CM | POA: Diagnosis not present

## 2022-11-13 ENCOUNTER — Telehealth: Payer: Self-pay

## 2022-11-13 NOTE — Telephone Encounter (Signed)
Transition Care Management Follow-up Telephone Call Date of discharge and from where: [facility] 11/12/2022 How have you been since you were released from the hospital? sore Any questions or concerns? No  Items Reviewed: Did the pt receive and understand the discharge instructions provided? Yes  Medications obtained and verified? Yes  Other? No  Any new allergies since your discharge? No  Dietary orders reviewed? Yes Do you have support at home? Yes   Home Care and Equipment/Supplies: Were home health services ordered? no If so, what is the name of the agency? N/a  Has the agency set up a time to come to the patient's home? not applicable Were any new equipment or medical supplies ordered?  No What is the name of the medical supply agency? N/a Were you able to get the supplies/equipment? not applicable Do you have any questions related to the use of the equipment or supplies? No  Functional Questionnaire: (I = Independent and D = Dependent) ADLs: I  Bathing/Dressing- I  Meal Prep- I  Eating- I  Maintaining continence- I  Transferring/Ambulation- I  Managing Meds- I  Follow up appointments reviewed:  PCP Hospital f/u appt confirmed? No   Specialist Hospital f/u appt confirmed? No  Scheduled to see Dr Redmond Pulling on 11/25/2022 @ 9:30. Are transportation arrangements needed? No  If their condition worsens, is the pt aware to call PCP or go to the Emergency Dept.? Yes Was the patient provided with contact information for the PCP's office or ED? Yes Was to pt encouraged to call back with questions or concerns? Yes Juanda Crumble, LPN [place] Direct Dial [phone]

## 2022-11-22 ENCOUNTER — Ambulatory Visit (INDEPENDENT_AMBULATORY_CARE_PROVIDER_SITE_OTHER): Payer: Medicaid Other | Admitting: Physician Assistant

## 2022-11-22 ENCOUNTER — Encounter: Payer: Self-pay | Admitting: Physician Assistant

## 2022-11-22 VITALS — BP 147/85 | HR 100 | Ht 68.0 in | Wt 196.0 lb

## 2022-11-22 DIAGNOSIS — E876 Hypokalemia: Secondary | ICD-10-CM

## 2022-11-22 DIAGNOSIS — R600 Localized edema: Secondary | ICD-10-CM

## 2022-11-22 DIAGNOSIS — Z86711 Personal history of pulmonary embolism: Secondary | ICD-10-CM

## 2022-11-22 DIAGNOSIS — J942 Hemothorax: Secondary | ICD-10-CM

## 2022-11-22 DIAGNOSIS — Z79899 Other long term (current) drug therapy: Secondary | ICD-10-CM

## 2022-11-22 DIAGNOSIS — E1121 Type 2 diabetes mellitus with diabetic nephropathy: Secondary | ICD-10-CM

## 2022-11-22 DIAGNOSIS — J90 Pleural effusion, not elsewhere classified: Secondary | ICD-10-CM | POA: Diagnosis not present

## 2022-11-22 MED ORDER — POTASSIUM CHLORIDE CRYS ER 20 MEQ PO TBCR: EXTENDED_RELEASE_TABLET | ORAL | 0 refills | Status: DC

## 2022-11-22 MED ORDER — TRAMADOL HCL 50 MG PO TABS: 50.0000 mg | ORAL_TABLET | Freq: Four times a day (QID) | ORAL | 0 refills | Status: AC | PRN

## 2022-11-22 MED ORDER — METFORMIN HCL 1000 MG PO TABS: 1000.0000 mg | ORAL_TABLET | Freq: Two times a day (BID) | ORAL | 0 refills | Status: DC

## 2022-11-22 MED ORDER — GLIMEPIRIDE 4 MG PO TABS: 4.0000 mg | ORAL_TABLET | Freq: Two times a day (BID) | ORAL | 0 refills | Status: DC

## 2022-11-22 MED ORDER — FUROSEMIDE 20 MG PO TABS: ORAL_TABLET | ORAL | 1 refills | Status: DC
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
## 2022-11-25 DIAGNOSIS — J942 Hemothorax: Secondary | ICD-10-CM | POA: Diagnosis not present

## 2022-11-25 DIAGNOSIS — Z1330 Encounter for screening examination for mental health and behavioral disorders, unspecified: Secondary | ICD-10-CM | POA: Diagnosis not present

## 2022-11-26 ENCOUNTER — Encounter: Payer: Self-pay | Admitting: Physician Assistant

## 2022-11-26 DIAGNOSIS — J942 Hemothorax: Secondary | ICD-10-CM | POA: Insufficient documentation

## 2022-11-26 DIAGNOSIS — Z86711 Personal history of pulmonary embolism: Secondary | ICD-10-CM | POA: Insufficient documentation

## 2022-11-26 DIAGNOSIS — R600 Localized edema: Secondary | ICD-10-CM | POA: Insufficient documentation

## 2022-12-09 ENCOUNTER — Encounter: Payer: Self-pay | Admitting: Physician Assistant

## 2022-12-09 ENCOUNTER — Telehealth: Payer: Self-pay

## 2022-12-09 NOTE — Telephone Encounter (Signed)
Patient also sent a mychart message, responded via mychart.

## 2022-12-09 NOTE — Telephone Encounter (Signed)
Pt stopped by for a doctors note to go back to work as soon as possible. His [employer] paperwork stated that he could go back to work at  the end of the month. He wanted to know if he could go back sooner.  Pt would like a call to pick the note up if there is nothing else needed. Tvt

## 2022-12-09 NOTE — Telephone Encounter (Signed)
Ok for work note to be released back to work with no restrictions starting today.

## 2022-12-13 LAB — HM DIABETES EYE EXAM

## 2022-12-27 DIAGNOSIS — S42232D 3-part fracture of surgical neck of left humerus, subsequent encounter for fracture with routine healing: Secondary | ICD-10-CM | POA: Diagnosis not present

## 2022-12-27 DIAGNOSIS — S42102D Fracture of unspecified part of scapula, left shoulder, subsequent encounter for fracture with routine healing: Secondary | ICD-10-CM | POA: Diagnosis not present

## 2022-12-27 DIAGNOSIS — S42012D Anterior displaced fracture of sternal end of left clavicle, subsequent encounter for fracture with routine healing: Secondary | ICD-10-CM | POA: Diagnosis not present

## 2022-12-27 DIAGNOSIS — S42202D Unspecified fracture of upper end of left humerus, subsequent encounter for fracture with routine healing: Secondary | ICD-10-CM | POA: Diagnosis not present

## 2022-12-27 DIAGNOSIS — S42142D Displaced fracture of glenoid cavity of scapula, left shoulder, subsequent encounter for fracture with routine healing: Secondary | ICD-10-CM | POA: Diagnosis not present

## 2023-02-14 DIAGNOSIS — M25612 Stiffness of left shoulder, not elsewhere classified: Secondary | ICD-10-CM | POA: Diagnosis not present

## 2023-02-14 DIAGNOSIS — R29898 Other symptoms and signs involving the musculoskeletal system: Secondary | ICD-10-CM | POA: Diagnosis not present

## 2023-02-14 DIAGNOSIS — M25512 Pain in left shoulder: Secondary | ICD-10-CM | POA: Diagnosis not present

## 2023-02-21 ENCOUNTER — Ambulatory Visit (INDEPENDENT_AMBULATORY_CARE_PROVIDER_SITE_OTHER): Payer: Medicaid Other | Admitting: Physician Assistant

## 2023-02-21 ENCOUNTER — Encounter: Payer: Self-pay | Admitting: Physician Assistant

## 2023-02-21 VITALS — BP 107/68 | HR 96 | Ht 68.0 in | Wt 190.0 lb

## 2023-02-21 DIAGNOSIS — M720 Palmar fascial fibromatosis [Dupuytren]: Secondary | ICD-10-CM | POA: Diagnosis not present

## 2023-02-21 DIAGNOSIS — E785 Hyperlipidemia, unspecified: Secondary | ICD-10-CM

## 2023-02-21 DIAGNOSIS — R030 Elevated blood-pressure reading, without diagnosis of hypertension: Secondary | ICD-10-CM | POA: Diagnosis not present

## 2023-02-21 DIAGNOSIS — E1121 Type 2 diabetes mellitus with diabetic nephropathy: Secondary | ICD-10-CM | POA: Diagnosis not present

## 2023-02-21 DIAGNOSIS — Z1329 Encounter for screening for other suspected endocrine disorder: Secondary | ICD-10-CM

## 2023-02-21 DIAGNOSIS — R809 Proteinuria, unspecified: Secondary | ICD-10-CM

## 2023-02-21 DIAGNOSIS — Z7984 Long term (current) use of oral hypoglycemic drugs: Secondary | ICD-10-CM

## 2023-02-21 DIAGNOSIS — Z125 Encounter for screening for malignant neoplasm of prostate: Secondary | ICD-10-CM

## 2023-02-21 DIAGNOSIS — Z955 Presence of coronary angioplasty implant and graft: Secondary | ICD-10-CM

## 2023-02-21 LAB — POCT GLYCOSYLATED HEMOGLOBIN (HGB A1C): HbA1c, POC (controlled diabetic range): 6.7 % (ref 0.0–7.0)

## 2023-02-21 MED ORDER — METFORMIN HCL 1000 MG PO TABS
1000.0000 mg | ORAL_TABLET | Freq: Two times a day (BID) | ORAL | 0 refills | Status: DC
Start: 2023-02-21 — End: 2023-05-30

## 2023-02-21 MED ORDER — GLIMEPIRIDE 4 MG PO TABS: 4.0000 mg | ORAL_TABLET | Freq: Two times a day (BID) | ORAL | 0 refills | Status: DC

## 2023-02-21 MED ORDER — LISINOPRIL 2.5 MG PO TABS: 2.5000 mg | ORAL_TABLET | Freq: Every day | ORAL | 1 refills | Status: DC

## 2023-02-21 MED ORDER — ATORVASTATIN CALCIUM 80 MG PO TABS: 80.0000 mg | ORAL_TABLET | Freq: Every day | ORAL | 3 refills | Status: DC
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
## 2023-02-21 NOTE — Patient Instructions (Signed)
Dupuytren's Contracture Dupuytren's contracture is a condition in which tissue under the skin of the palm becomes thick. This causes one or more of the fingers to curl inward (contract) toward the palm. After a while, the fingers may not be able to straighten out. This condition affects some or all of the fingers and the palm of the hand. This condition may affect one or both hands. Dupuytren's contracture is a long-term (chronic) condition that develops (progresses) slowly over time. There is no cure, but symptoms can be managed and progression can be slowed with treatment. This condition is usually not dangerous or painful, but it can interfere with everyday tasks. What are the causes?  This condition is caused by tissue (fascia) in the palm that gets thicker and tighter. When the fascia thickens, it pulls on the cords of tissue (tendons) that control finger movement. This causes the fingers to contract. The cause of fascia thickening is not known. However, the condition is often passed along from parent to child (inherited). What increases the risk? The following factors may make you more likely to develop this condition: Being 40 years of age or older. Being male. Having a family history of this condition. Using tobacco products, including cigarettes, chewing tobacco, and e-cigarettes. Drinking alcohol excessively. Having diabetes. Having a seizure disorder. What are the signs or symptoms? Early symptoms of this condition may include: Thick, puckered skin on the hand. One or more lumps (nodules) on the palm. Nodules may be tender when they first appear, but they are generally painless. Later symptoms of this condition may include: Thick cords of tissue in the palm. Fingers curled up toward the palm. Inability to straighten the fingers into their normal position. Though this condition is usually painless, you may have discomfort when holding or grabbing objects. How is this  diagnosed? This condition is diagnosed with a physical exam, which may include: Looking at your hands and feeling your palms. This is to check for thickened fascia and nodules. Measuring finger motion. Doing the [place] tabletop test. You may be asked to try to put your hand on a surface, with your palm down and your fingers straight out. How is this treated? There is no cure for this condition, but treatment can relieve discomfort and make symptoms more manageable. Treatment options may include: Physical therapy. This can strengthen your hand and increase flexibility. Occupational therapy. This can help you with everyday tasks that may be more difficult because of your condition. Shots (injections). Substances may be injected into your hand, such as: Medicines that help to decrease swelling (corticosteroids). Proteins (collagenase) to weaken thick tissue. After a collagenase injection, your health care provider may stretch your fingers. Needle aponeurotomy. A needle is pushed through the skin and into the fascia. Moving the needle against the fascia can weaken or break up the thick tissue. Surgery. This may be needed if your condition causes discomfort or interferes with everyday activities. Physical therapy is usually needed after surgery. No treatment is guaranteed to cure this condition. Recurrence of symptoms is common. Follow these instructions at home: Hand care Take these actions to help protect your hand from possible injury: Use tools that have padded grips. Wear protective gloves while you work with your hands. Avoid repetitive hand movements. General instructions Take over-the-counter and prescription medicines only as told by your health care provider. Manage any other conditions that you have, such as diabetes. If physical therapy was prescribed, do exercises as told by your health care provider. Do not   use any products that contain nicotine or tobacco, such as cigarettes,  e-cigarettes, and chewing tobacco. If you need help quitting, ask your health care provider. If you drink alcohol: Limit how much you have to: 0-1 drink a day for women who are not pregnant. 0-2 drinks a day for men. Be aware of how much alcohol is in your drink. In the [place] one drink equals one 12 oz bottle of beer (355 mL), one 5 oz glass of wine (148 mL), or one 1 oz glass of hard liquor (44 mL). Keep all follow-up visits as told by your health care provider. This is important. Contact a health care provider if: You develop new symptoms, or your symptoms get worse. You have pain that gets worse or does not get better with medicine. You have difficulty or discomfort with everyday tasks. You develop numbness or tingling. Get help right away if: You have severe pain. Your fingers change color or become unusually cold. Summary Dupuytren's contracture is a condition in which tissue under the skin of the palm becomes thick. This condition is caused by tissue (fascia) that thickens. When it thickens, it pulls on the cords of tissue (tendons) that control finger movement and makes the fingers contract. You are more likely to develop this condition if you are a man, are over 40 years of age, have a family history of the condition, and drink a lot of alcohol. This condition can be treated with physical and occupational therapy, injections, and surgery. Follow instructions about how to care for your hand. Get help right away if you have severe pain or your fingers change color or become cold. This information is not intended to replace advice given to you by your health care provider. Make sure you discuss any questions you have with your health care provider. Document Revised: 01/02/2021 Document Reviewed: 01/02/2021 Elsevier Patient Education  2023 [employer] Inc.  

## 2023-02-24 ENCOUNTER — Encounter: Payer: Self-pay | Admitting: Physician Assistant

## 2023-04-09 ENCOUNTER — Telehealth: Payer: Self-pay | Admitting: Physician Assistant

## 2023-04-09 ENCOUNTER — Encounter: Payer: Self-pay | Admitting: Physician Assistant
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
## 2023-04-09 NOTE — Telephone Encounter (Signed)
Faxed letter. Patient is aware.  

## 2023-04-14 ENCOUNTER — Telehealth: Payer: Self-pay | Admitting: Physician Assistant

## 2023-04-14 NOTE — Telephone Encounter (Signed)
I received a fax conformation. I faxed the letter again.

## 2023-04-14 NOTE — Telephone Encounter (Signed)
Patient called today he asked if letter went thru to [facility] and which faxed number was used [employer] stated they didn't receive it

## 2023-05-14 ENCOUNTER — Telehealth: Payer: Self-pay | Admitting: Physician Assistant

## 2023-05-14 NOTE — Telephone Encounter (Signed)
Patient called in wanting to do labs but was unsure about the labs he needed done. Please Advise.

## 2023-05-16 ENCOUNTER — Other Ambulatory Visit: Payer: Self-pay

## 2023-05-16 DIAGNOSIS — M720 Palmar fascial fibromatosis [Dupuytren]: Secondary | ICD-10-CM

## 2023-05-16 DIAGNOSIS — Z1329 Encounter for screening for other suspected endocrine disorder: Secondary | ICD-10-CM

## 2023-05-16 DIAGNOSIS — Z125 Encounter for screening for malignant neoplasm of prostate: Secondary | ICD-10-CM

## 2023-05-16 DIAGNOSIS — E785 Hyperlipidemia, unspecified: Secondary | ICD-10-CM

## 2023-05-16 DIAGNOSIS — E1121 Type 2 diabetes mellitus with diabetic nephropathy: Secondary | ICD-10-CM

## 2023-05-16 NOTE — Telephone Encounter (Signed)
It looks like she already ordered a panel back in May and he had never had it drawn.  So was just get those reordered under [facility] and he can come in and have those done.

## 2023-05-16 NOTE — Telephone Encounter (Signed)
Patient informed. 

## 2023-05-29 ENCOUNTER — Encounter: Payer: Self-pay | Admitting: Physician Assistant

## 2023-05-30 ENCOUNTER — Ambulatory Visit: Payer: Medicaid Other | Admitting: Physician Assistant

## 2023-05-30 ENCOUNTER — Encounter: Payer: Self-pay | Admitting: Physician Assistant

## 2023-05-30 VITALS — BP 149/87 | HR 80 | Ht 68.0 in | Wt 201.0 lb

## 2023-05-30 DIAGNOSIS — M720 Palmar fascial fibromatosis [Dupuytren]: Secondary | ICD-10-CM | POA: Diagnosis not present

## 2023-05-30 DIAGNOSIS — I10 Essential (primary) hypertension: Secondary | ICD-10-CM

## 2023-05-30 DIAGNOSIS — Z955 Presence of coronary angioplasty implant and graft: Secondary | ICD-10-CM

## 2023-05-30 DIAGNOSIS — E1121 Type 2 diabetes mellitus with diabetic nephropathy: Secondary | ICD-10-CM | POA: Diagnosis not present

## 2023-05-30 DIAGNOSIS — E785 Hyperlipidemia, unspecified: Secondary | ICD-10-CM | POA: Diagnosis not present

## 2023-05-30 DIAGNOSIS — Z1329 Encounter for screening for other suspected endocrine disorder: Secondary | ICD-10-CM | POA: Diagnosis not present

## 2023-05-30 DIAGNOSIS — Z1211 Encounter for screening for malignant neoplasm of colon: Secondary | ICD-10-CM

## 2023-05-30 DIAGNOSIS — R600 Localized edema: Secondary | ICD-10-CM | POA: Diagnosis not present

## 2023-05-30 DIAGNOSIS — Z125 Encounter for screening for malignant neoplasm of prostate: Secondary | ICD-10-CM | POA: Diagnosis not present

## 2023-05-30 DIAGNOSIS — Z7984 Long term (current) use of oral hypoglycemic drugs: Secondary | ICD-10-CM | POA: Diagnosis not present

## 2023-05-30 LAB — POCT GLYCOSYLATED HEMOGLOBIN (HGB A1C): Hemoglobin A1C: 6.6 % — AB (ref 4.0–5.6)

## 2023-05-30 MED ORDER — LISINOPRIL 5 MG PO TABS: 5.0000 mg | ORAL_TABLET | Freq: Every day | ORAL | 0 refills | Status: DC

## 2023-05-30 MED ORDER — GLIMEPIRIDE 4 MG PO TABS
4.0000 mg | ORAL_TABLET | Freq: Two times a day (BID) | ORAL | 0 refills | Status: DC
Start: 2023-05-30 — End: 2023-09-01

## 2023-05-30 MED ORDER — METFORMIN HCL 1000 MG PO TABS
1000.0000 mg | ORAL_TABLET | Freq: Two times a day (BID) | ORAL | 0 refills | Status: DC
Start: 2023-05-30 — End: 2023-11-14

## 2023-05-30 NOTE — Patient Instructions (Signed)
Needs colonoscopy.
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
## 2023-05-31 LAB — CBC WITH DIFFERENTIAL/PLATELET: WBC: 12.1 10*3/uL — ABNORMAL HIGH (ref 3.4–10.8)

## 2023-06-02 ENCOUNTER — Other Ambulatory Visit: Payer: Self-pay

## 2023-06-02 DIAGNOSIS — D72829 Elevated white blood cell count, unspecified: Secondary | ICD-10-CM

## 2023-06-02 NOTE — Progress Notes (Signed)
LDL is to goal.  Thyroid and Prostate normal range.  WBC is elevated a little. This can be due to infection. Any fever, chills, sores, wounds?  Any recent prednisone use?  Recheck in 1 week.

## 2023-06-10 DIAGNOSIS — D72829 Elevated white blood cell count, unspecified: Secondary | ICD-10-CM | POA: Diagnosis not present

## 2023-06-11 LAB — CBC WITH DIFFERENTIAL/PLATELET
Basophils Absolute: 0.1 10*3/uL (ref 0.0–0.2)
Basos: 2 %
EOS (ABSOLUTE): 0.4 10*3/uL (ref 0.0–0.4)
Eos: 5 %
Hematocrit: 40.4 % (ref 37.5–51.0)
Hemoglobin: 13.4 g/dL (ref 13.0–17.7)
Immature Grans (Abs): 0 10*3/uL (ref 0.0–0.1)
Immature Granulocytes: 0 %
Lymphocytes Absolute: 2.6 10*3/uL (ref 0.7–3.1)
Lymphs: 29 %
MCH: 32.3 pg (ref 26.6–33.0)
MCHC: 33.2 g/dL (ref 31.5–35.7)
MCV: 97 fL (ref 79–97)
Monocytes Absolute: 0.8 10*3/uL (ref 0.1–0.9)
Monocytes: 9 %
Neutrophils Absolute: 4.8 10*3/uL (ref 1.4–7.0)
Neutrophils: 55 %
Platelets: 377 10*3/uL (ref 150–450)
RBC: 4.15 x10E6/uL (ref 4.14–5.80)
RDW: 12.7 % (ref 11.6–15.4)
WBC: 8.8 10*3/uL (ref 3.4–10.8)

## 2023-06-11 NOTE — Progress Notes (Signed)
CBC looks GREAT. Much improved.

## 2023-06-24 ENCOUNTER — Ambulatory Visit: Payer: Medicaid Other | Admitting: Physician Assistant

## 2023-09-01 ENCOUNTER — Encounter: Payer: Self-pay | Admitting: Physician Assistant

## 2023-09-01 ENCOUNTER — Ambulatory Visit (INDEPENDENT_AMBULATORY_CARE_PROVIDER_SITE_OTHER): Payer: Medicaid Other | Admitting: Physician Assistant

## 2023-09-01 VITALS — BP 103/60 | HR 85 | Ht 68.0 in | Wt 203.2 lb

## 2023-09-01 DIAGNOSIS — D649 Anemia, unspecified: Secondary | ICD-10-CM | POA: Insufficient documentation

## 2023-09-01 DIAGNOSIS — G8929 Other chronic pain: Secondary | ICD-10-CM | POA: Diagnosis not present

## 2023-09-01 DIAGNOSIS — E785 Hyperlipidemia, unspecified: Secondary | ICD-10-CM | POA: Diagnosis not present

## 2023-09-01 DIAGNOSIS — M25512 Pain in left shoulder: Secondary | ICD-10-CM

## 2023-09-01 DIAGNOSIS — E1121 Type 2 diabetes mellitus with diabetic nephropathy: Secondary | ICD-10-CM | POA: Diagnosis not present

## 2023-09-01 DIAGNOSIS — I959 Hypotension, unspecified: Secondary | ICD-10-CM | POA: Diagnosis not present

## 2023-09-01 DIAGNOSIS — Z1211 Encounter for screening for malignant neoplasm of colon: Secondary | ICD-10-CM | POA: Diagnosis not present

## 2023-09-01 DIAGNOSIS — D72829 Elevated white blood cell count, unspecified: Secondary | ICD-10-CM | POA: Diagnosis not present

## 2023-09-01 DIAGNOSIS — M7502 Adhesive capsulitis of left shoulder: Secondary | ICD-10-CM | POA: Diagnosis not present

## 2023-09-01 DIAGNOSIS — I10 Essential (primary) hypertension: Secondary | ICD-10-CM

## 2023-09-01 DIAGNOSIS — Z7984 Long term (current) use of oral hypoglycemic drugs: Secondary | ICD-10-CM

## 2023-09-01 MED ORDER — LISINOPRIL 2.5 MG PO TABS: 2.5000 mg | ORAL_TABLET | Freq: Every day | ORAL | 1 refills | Status: DC

## 2023-09-01 NOTE — Patient Instructions (Addendum)
Start lisinopril 2.5mg   Only take metformin twice a day for sugars  Adhesive Capsulitis  Adhesive capsulitis, also called frozen shoulder, causes the shoulder to become stiff and painful to move. This condition happens when there is inflammation of the tendons and ligaments that surround the shoulder joint (shoulder capsule). Tendons are tissues that connect muscle to bone. Ligaments are tissues that connect bones to each other. What are the causes? This condition may be caused by: An injury to your shoulder joint. Straining your shoulder. Not moving your shoulder for a period of time. This can happen if your arm was injured or in a sling. In some cases, the cause is not known. What increases the risk? You are more likely to develop this condition if: You are male. You are older than 62 years of age. You have certain other conditions, such as: Diabetes. Thyroid problems. Stroke. You recently had surgery, especially shoulder or neck surgery. What are the signs or symptoms? Symptoms of this condition include: Pain in your shoulder when you move your arm. There may also be pain when parts of your shoulder are touched. The pain may be worse at night or when you are resting. Not being able to move your shoulder normally. Sudden muscle tightening (muscle spasms). How is this diagnosed? This condition is diagnosed with a physical exam and imaging tests, such as an X-ray or MRI. How is this treated? This condition may be treated with: Treatment of the injury or condition that caused the adhesive capsulitis. Medicines for pain, inflammation, or muscle spasms. Injections of medicine (steroids) into the shoulder joint. Physical therapy. This involves doing exercises to get the shoulder moving again. Shoulder manipulation. This is a procedure to move the shoulder into another position. It is done after you are given a medicine to make you fall asleep (general anesthesia). The joint may also  be injected with salt water at high pressure to break down scarring. Surgery. This may be done in severe cases when other treatments do not work. Some less common treatments include: Injection of hyaluronic acid into the shoulder joint. This substance is a normal part of the fluid inside joints. It helps lubricate the joint and can lower inflammation. Injection of platelet-rich plasma into the shoulder joint. This uses a type of blood cell from your own blood that may speed up the healing process. Sending energy waves to the affected area (extracorporeal shock wave therapy). Most people recover completely from adhesive capsulitis, but some may not get back full shoulder movement. Follow these instructions at home: Managing pain, stiffness, and swelling     If told, put ice on the injured area. Put ice in a plastic bag. Place a towel between your skin and the bag. Leave the ice on for 20 minutes, 2-3 times a day. If told, apply heat to the affected area before you exercise. Use the heat source that your health care provider recommends, such as a moist heat pack or a heating pad. Place a towel between your skin and the heat source. Leave the heat on for 20-30 minutes. If your skin turns bright red, remove the ice or heat right away to prevent skin damage. The risk of damage is higher if you cannot feel pain, heat, or cold. General instructions Take over-the-counter and prescription medicines only as told by your provider. If you are being treated with physical therapy, do exercises as told. Avoid activities or exercises that put a lot of demand on your shoulder, such as throwing.  These can make pain worse. Contact a health care provider if: You have new symptoms. Your symptoms get worse. This information is not intended to replace advice given to you by your health care provider. Make sure you discuss any questions you have with your health care provider. Document Revised: 07/08/2022  Document Reviewed: 07/08/2022 Elsevier Patient Education  2024 [employer].
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
## 2023-09-02 ENCOUNTER — Encounter: Payer: Self-pay | Admitting: Physician Assistant

## 2023-09-02 ENCOUNTER — Telehealth: Payer: Self-pay

## 2023-09-02 DIAGNOSIS — M7502 Adhesive capsulitis of left shoulder: Secondary | ICD-10-CM | POA: Insufficient documentation

## 2023-09-02 DIAGNOSIS — I959 Hypotension, unspecified: Secondary | ICD-10-CM | POA: Insufficient documentation

## 2023-09-02 LAB — CMP14+EGFR
ALT: 13 [IU]/L (ref 0–44)
AST: 17 [IU]/L (ref 0–40)
Albumin: 4.3 g/dL (ref 3.9–4.9)
Alkaline Phosphatase: 91 [IU]/L (ref 44–121)
BUN/Creatinine Ratio: 15 (ref 10–24)
BUN: 17 mg/dL (ref 8–27)
Bilirubin Total: 0.6 mg/dL (ref 0.0–1.2)
CO2: 22 mmol/L (ref 20–29)
Calcium: 9.4 mg/dL (ref 8.6–10.2)
Chloride: 107 mmol/L — ABNORMAL HIGH (ref 96–106)
Creatinine, Ser: 1.12 mg/dL (ref 0.76–1.27)
Globulin, Total: 2.3 g/dL (ref 1.5–4.5)
Glucose: 153 mg/dL — ABNORMAL HIGH (ref 70–99)
Potassium: 4.3 mmol/L (ref 3.5–5.2)
Sodium: 143 mmol/L (ref 134–144)
Total Protein: 6.6 g/dL (ref 6.0–8.5)
eGFR: 74 mL/min/{1.73_m2} (ref >59)

## 2023-09-02 LAB — CBC WITH DIFFERENTIAL/PLATELET
Basophils Absolute: 0.1 10*3/uL (ref 0.0–0.2)
Basos: 1 %
EOS (ABSOLUTE): 0.2 10*3/uL (ref 0.0–0.4)
Eos: 2 %
Hematocrit: 42 % (ref 37.5–51.0)
Hemoglobin: 13.6 g/dL (ref 13.0–17.7)
Immature Grans (Abs): 0.1 10*3/uL (ref 0.0–0.1)
Immature Granulocytes: 1 %
Lymphocytes Absolute: 2.2 10*3/uL (ref 0.7–3.1)
Lymphs: 18 %
MCH: 31.7 pg (ref 26.6–33.0)
MCHC: 32.4 g/dL (ref 31.5–35.7)
MCV: 98 fL — ABNORMAL HIGH (ref 79–97)
Monocytes Absolute: 1.3 10*3/uL — ABNORMAL HIGH (ref 0.1–0.9)
Monocytes: 11 %
Neutrophils Absolute: 8.2 10*3/uL — ABNORMAL HIGH (ref 1.4–7.0)
Neutrophils: 67 %
Platelets: 410 10*3/uL (ref 150–450)
RBC: 4.29 x10E6/uL (ref 4.14–5.80)
RDW: 13 % (ref 11.6–15.4)
WBC: 12.1 10*3/uL — ABNORMAL HIGH (ref 3.4–10.8)

## 2023-09-02 LAB — HEMOGLOBIN A1C
Est. average glucose Bld gHb Est-mCnc: 151 mg/dL
Hgb A1c MFr Bld: 6.9 % — ABNORMAL HIGH (ref 4.8–5.6)

## 2023-09-02 NOTE — Telephone Encounter (Signed)
Patient advised of results.

## 2023-09-02 NOTE — Progress Notes (Signed)
Vern,   Your A1c has increased some. You need to take metformin twice a day regularly and then we can recheck in 3 months and determine if you need any other medications. Continue to watch sugars and carbs in diet. Increase protein to at least 80g a day.   No anemia.   WBC is up again but seems to come up and down. Over the last few months. Will continue to monitor.

## 2023-09-02 NOTE — Telephone Encounter (Signed)
Copied from CRM [id number]. Topic: Clinical - Lab/Test Results >> Sep 02, 2023  2:13 PM Gaetano Hawthorne wrote: Reason for CRM: Patient has questions regarding his lab results - he sent a message via MyChart as well and he missed a call from [facility] today as well. Please call the patient to discuss.

## 2023-09-10 ENCOUNTER — Ambulatory Visit (INDEPENDENT_AMBULATORY_CARE_PROVIDER_SITE_OTHER): Payer: Medicaid Other | Admitting: Physician Assistant

## 2023-09-10 ENCOUNTER — Encounter: Payer: Self-pay | Admitting: Physician Assistant

## 2023-09-10 VITALS — BP 131/83 | HR 82 | Resp 12 | Ht 68.0 in | Wt 201.6 lb

## 2023-09-10 DIAGNOSIS — R051 Acute cough: Secondary | ICD-10-CM

## 2023-09-10 DIAGNOSIS — J329 Chronic sinusitis, unspecified: Secondary | ICD-10-CM | POA: Diagnosis not present

## 2023-09-10 DIAGNOSIS — J40 Bronchitis, not specified as acute or chronic: Secondary | ICD-10-CM | POA: Diagnosis not present

## 2023-09-10 DIAGNOSIS — M25512 Pain in left shoulder: Secondary | ICD-10-CM | POA: Diagnosis not present

## 2023-09-10 DIAGNOSIS — M7502 Adhesive capsulitis of left shoulder: Secondary | ICD-10-CM | POA: Diagnosis not present

## 2023-09-10 MED ORDER — BENZONATATE 200 MG PO CAPS
200.0000 mg | ORAL_CAPSULE | Freq: Three times a day (TID) | ORAL | 0 refills | Status: DC | PRN
Start: 2023-09-10 — End: 2023-12-09

## 2023-09-10 MED ORDER — AZITHROMYCIN 250 MG PO TABS: ORAL_TABLET | ORAL | 0 refills | Status: DC

## 2023-09-10 NOTE — Patient Instructions (Signed)

## 2023-09-10 NOTE — Progress Notes (Signed)
   Acute Office Visit  Subjective:     Patient ID: Travis Roach, male    DOB: Sep 13, 1961, 62 y.o.   MRN: [id number]  Chief Complaint  Patient presents with   Cough   Nasal Congestion    Cough Pertinent negatives include no fever, sore throat or shortness of breath.   Patient is in today for rhinorrhea, congestion, postnasal drip, sneezing, and productive cough starting Friday night. Feeling congestion in chest now. Taking Nyquil. No fever or body aches. CBC last Monday which showed elevated WBCs and neutros. PMH diabetes and splenectomy.  Patient also reports diarrhea since increasing Metformin dosage. He also reports high blood glucose this morning.   Review of Systems  Constitutional:  Negative for fever.  HENT:  Positive for congestion. Negative for sore throat.   Respiratory:  Positive for cough and sputum production. Negative for shortness of breath.   Gastrointestinal:  Positive for diarrhea.      Objective:    BP 131/83   Pulse 82   Resp 12   Ht 5\' 8"  (1.727 m)   Wt 201 lb 9.6 oz (91.4 kg)   SpO2 97%   BMI 30.65 kg/m    Physical Exam HENT:     Right Ear: Tympanic membrane, ear canal and external ear normal.     Left Ear: Tympanic membrane, ear canal and external ear normal.     Nose: Nose normal.     Mouth/Throat:     Pharynx: Uvula swelling present. No pharyngeal swelling, oropharyngeal exudate or posterior oropharyngeal erythema.     Tonsils: No tonsillar exudate or tonsillar abscesses.  Cardiovascular:     Rate and Rhythm: Normal rate and regular rhythm.     Heart sounds: Normal heart sounds.  Pulmonary:     Effort: Pulmonary effort is normal.     Breath sounds: Normal breath sounds.  Neurological:     Mental Status: He is alert.       Assessment & Plan:   [name] KitchenMarland KitchenVernon "Vern" was seen today for cough and nasal congestion.  Diagnoses and all orders for this visit:  Sinobronchitis -     azithromycin (ZITHROMAX Z-PAK) 250 MG tablet; Take 2  tablets (500 mg) on  Day 1,  followed by 1 tablet (250 mg) once daily on Days 2 through 5. -     benzonatate (TESSALON) 200 MG capsule; Take 1 capsule (200 mg total) by mouth 3 (three) times daily as needed.  Acute cough -     benzonatate (TESSALON) 200 MG capsule; Take 1 capsule (200 mg total) by mouth 3 (three) times daily as needed.    Due to PMH of diabetes and splenectomy and length of illness, prescribed antibiotics to cover bacterial infection. Prescribed tessalon perles for cough. Ok to continue nyquil at night  Discussed with patient that blood glucose can increase with infection. Discussed with patient to continue Metformin for the next 3 months. If diarrhea continues, consider change in diabetic medication. Recommended Benefiber.  Return if symptoms worsen or fail to improve.  Tandy Gaw, PA-C

## 2023-09-18 ENCOUNTER — Encounter: Payer: Self-pay | Admitting: Physician Assistant

## 2023-09-18 DIAGNOSIS — E1121 Type 2 diabetes mellitus with diabetic nephropathy: Secondary | ICD-10-CM

## 2023-09-19 MED ORDER — GLIMEPIRIDE 4 MG PO TABS: 4.0000 mg | ORAL_TABLET | Freq: Two times a day (BID) | ORAL | 0 refills | Status: DC

## 2023-09-24 ENCOUNTER — Encounter: Payer: Self-pay | Admitting: Physician Assistant

## 2023-09-24 NOTE — Telephone Encounter (Signed)
 Care team updated and letter sent for eye exam notes.

## 2023-11-13 ENCOUNTER — Other Ambulatory Visit: Payer: Self-pay | Admitting: Physician Assistant

## 2023-11-13 DIAGNOSIS — E1121 Type 2 diabetes mellitus with diabetic nephropathy: Secondary | ICD-10-CM

## 2023-12-09 ENCOUNTER — Encounter: Payer: Self-pay | Admitting: Physician Assistant

## 2023-12-09 ENCOUNTER — Ambulatory Visit (INDEPENDENT_AMBULATORY_CARE_PROVIDER_SITE_OTHER): Payer: Self-pay | Admitting: Physician Assistant

## 2023-12-09 VITALS — BP 145/90

## 2023-12-09 DIAGNOSIS — Z955 Presence of coronary angioplasty implant and graft: Secondary | ICD-10-CM

## 2023-12-09 DIAGNOSIS — Z7984 Long term (current) use of oral hypoglycemic drugs: Secondary | ICD-10-CM

## 2023-12-09 DIAGNOSIS — E1121 Type 2 diabetes mellitus with diabetic nephropathy: Secondary | ICD-10-CM

## 2023-12-09 DIAGNOSIS — I10 Essential (primary) hypertension: Secondary | ICD-10-CM

## 2023-12-09 DIAGNOSIS — E785 Hyperlipidemia, unspecified: Secondary | ICD-10-CM

## 2023-12-09 LAB — POCT GLYCOSYLATED HEMOGLOBIN (HGB A1C): Hemoglobin A1C: 6.3 % — AB (ref 4.0–5.6)

## 2023-12-09 MED ORDER — LISINOPRIL 5 MG PO TABS: 5.0000 mg | ORAL_TABLET | Freq: Every day | ORAL | 1 refills | Status: DC

## 2023-12-09 MED ORDER — ATORVASTATIN CALCIUM 80 MG PO TABS: 80.0000 mg | ORAL_TABLET | Freq: Every day | ORAL | 3 refills | Status: DC

## 2023-12-09 MED ORDER — GLIMEPIRIDE 4 MG PO TABS: 4.0000 mg | ORAL_TABLET | Freq: Two times a day (BID) | ORAL | 1 refills | Status: DC

## 2023-12-09 NOTE — Patient Instructions (Signed)
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
## 2023-12-10 ENCOUNTER — Encounter: Payer: Self-pay | Admitting: Physician Assistant

## 2024-02-12 ENCOUNTER — Other Ambulatory Visit: Payer: Self-pay | Admitting: Physician Assistant

## 2024-02-12 DIAGNOSIS — E1121 Type 2 diabetes mellitus with diabetic nephropathy: Secondary | ICD-10-CM

## 2024-02-13 MED ORDER — METFORMIN HCL 1000 MG PO TABS: 1000.0000 mg | ORAL_TABLET | Freq: Two times a day (BID) | ORAL | 0 refills | Status: DC

## 2024-06-09 ENCOUNTER — Encounter: Payer: Self-pay | Admitting: Physician Assistant

## 2024-06-09 ENCOUNTER — Ambulatory Visit (INDEPENDENT_AMBULATORY_CARE_PROVIDER_SITE_OTHER): Payer: Self-pay | Admitting: Physician Assistant

## 2024-06-09 VITALS — BP 120/72 | HR 84 | Ht 68.0 in | Wt 220.0 lb

## 2024-06-09 DIAGNOSIS — R809 Proteinuria, unspecified: Secondary | ICD-10-CM

## 2024-06-09 DIAGNOSIS — I10 Essential (primary) hypertension: Secondary | ICD-10-CM

## 2024-06-09 DIAGNOSIS — E1121 Type 2 diabetes mellitus with diabetic nephropathy: Secondary | ICD-10-CM

## 2024-06-09 DIAGNOSIS — R3129 Other microscopic hematuria: Secondary | ICD-10-CM

## 2024-06-09 DIAGNOSIS — R350 Frequency of micturition: Secondary | ICD-10-CM

## 2024-06-09 DIAGNOSIS — Z955 Presence of coronary angioplasty implant and graft: Secondary | ICD-10-CM

## 2024-06-09 DIAGNOSIS — E785 Hyperlipidemia, unspecified: Secondary | ICD-10-CM

## 2024-06-09 DIAGNOSIS — Z7984 Long term (current) use of oral hypoglycemic drugs: Secondary | ICD-10-CM

## 2024-06-09 LAB — POCT URINALYSIS DIP (CLINITEK)
Bilirubin, UA: NEGATIVE
Glucose, UA: NEGATIVE mg/dL — AB
Ketones, POC UA: NEGATIVE mg/dL
Leukocytes, UA: NEGATIVE
Nitrite, UA: NEGATIVE
Spec Grav, UA: 1.02 (ref 1.010–1.025)
Urobilinogen, UA: 0.2 U/dL
pH, UA: 5.5 (ref 5.0–8.0)

## 2024-06-09 LAB — POCT UA - MICROALBUMIN
Creatinine, POC: 200 mg/dL
Microalbumin Ur, POC: 80 mg/L

## 2024-06-09 LAB — POCT GLYCOSYLATED HEMOGLOBIN (HGB A1C): Hemoglobin A1C: 6.7 % — AB (ref 4.0–5.6)

## 2024-06-09 MED ORDER — GLIMEPIRIDE 4 MG PO TABS: 4.0000 mg | ORAL_TABLET | Freq: Two times a day (BID) | ORAL | 1 refills | Status: AC

## 2024-06-09 MED ORDER — METFORMIN HCL 1000 MG PO TABS: 1000.0000 mg | ORAL_TABLET | Freq: Two times a day (BID) | ORAL | 1 refills | Status: AC

## 2024-06-09 MED ORDER — ATORVASTATIN CALCIUM 80 MG PO TABS
80.0000 mg | ORAL_TABLET | Freq: Every day | ORAL | 3 refills | Status: AC
Start: 2024-06-09 — End: ?

## 2024-06-09 MED ORDER — LISINOPRIL 5 MG PO TABS: 5.0000 mg | ORAL_TABLET | Freq: Every day | ORAL | 1 refills | Status: AC

## 2024-06-09 NOTE — Patient Instructions (Addendum)
 Continue same medications Repeat UA to check for blood in urine in 2-3 weeks  Blood in the Pee (Hematuria) in Adults: What to Know  Hematuria is blood in the pee. You may be able to see blood in the pee. In some cases, a health care provider may find blood with a test.  Blood in the pee can be caused by infections of the kidney, bladder, or the urethra. The urethra is the tube that drains pee from the bladder.  Other causes may include: Kidney stones. Infection of the prostate. Cancer. Too much calcium  in the pee. Conditions that are passed from parent to child. Too much exercise. Infections can be treated with medicine. A kidney stone will usually leave your body when you pee. If infections or kidney stones didn't cause the blood in the urine, then more tests may be needed. It is very important to tell your provider about any blood in your pee, even if you have no pain or the blood stops with no treatment. Blood in the pee can be a sign of a very serious problem, such as cancer. Follow these instructions at home: Medicines Take your medicines only as told. If you were given antibiotics, take them as told. Do not stop taking them even if you start to feel better. Eating and drinking Drink more fluids as told. Aim to drink 3-4 quarts (2.8-3.8 L) a day. Avoid caffeine, tea, and carbonated drinks. These can bother the bladder. Avoid alcohol if a male because it may irritate the prostate. General instructions If you have been diagnosed with a kidney stone, strain your pee to catch the stone if told by your provider. Empty your bladder often. Avoid holding pee for a long time. If you're male, make sure that: You wipe from front to back after using the bathroom. You use each piece of toilet paper only once. You pee before and after sex. It's up to you to get the results of any tests. Ask when your results will be ready and how to get them. You may need to call or meet with your provider  to get your results. Keep all follow-up visits. Your provider will need to know about any changes or any new symptoms. Contact a health care provider if: Your symptoms don't get better after 3 days. Your symptoms get worse. You have back pain or belly pain. You have a fever or chills. You throw up or feel like you may throw up. You throw up every time you take medicine. Get help right away if: You pass blood clots in your pee. You pass out. These symptoms may be an emergency. Call 911 right away. Do not wait to see if the symptoms will go away. Do not drive yourself to the hospital. This information is not intended to replace advice given to you by your health care provider. Make sure you discuss any questions you have with your health care provider. Document Revised: 07/10/2023 Document Reviewed: 06/19/2023 Elsevier Patient Education  2024 [employer].

## 2024-06-09 NOTE — Progress Notes (Signed)
 Established Patient Office Visit  Subjective   Patient ID: Travis Roach, male    DOB: 1961/05/05  Age: 63 y.o. MRN: [id number]  Chief Complaint  Patient presents with   Medical Management of Chronic Issues    Last A1c 6.3    HPI Pt is a 63 yo male with T2DM, HTN, HLD, CAD with stent who presents to the clinic for follow up and medication refills.   Pt does not check his sugars. He denies any hypoglycemic events. He denies any CP, palpitations, headaches or vision changes. He is not working. He tries to walk regularly. He tries to keep to low sugar diet. He is compliant with medications. He is having more urinary frequency lately.    ROS See HPI>    Objective:     BP (!) 149/86   Pulse 84   Ht 5' 8 (1.727 m)   Wt 220 lb (99.8 kg)   SpO2 96%   BMI 33.45 kg/m  BP Readings from Last 3 Encounters:  06/09/24 (!) 149/86  12/09/23 (!) 145/90  09/10/23 131/83   Wt Readings from Last 3 Encounters:  06/09/24 220 lb (99.8 kg)  09/10/23 201 lb 9.6 oz (91.4 kg)  09/01/23 203 lb 4 oz (92.2 kg)    .SABRA Results for orders placed or performed in visit on 06/09/24  POCT HgB A1C   Collection Time: 06/09/24  1:29 PM  Result Value Ref Range   Hemoglobin A1C 6.7 (A) 4.0 - 5.6 %   HbA1c POC (<> result, manual entry)     HbA1c, POC (prediabetic range)     HbA1c, POC (controlled diabetic range)    POCT UA - Microalbumin   Collection Time: 06/09/24  1:29 PM  Result Value Ref Range   Microalbumin Ur, POC 80 mg/L   Creatinine, POC 200 mg/dL   Albumin/Creatinine Ratio, Urine, POC 30-300   POCT URINALYSIS DIP (CLINITEK)   Collection Time: 06/09/24  1:31 PM  Result Value Ref Range   Color, UA yellow yellow   Clarity, UA clear clear   Glucose, UA negative (A) negative mg/dL   Bilirubin, UA negative negative   Ketones, POC UA negative negative mg/dL   Spec Grav, UA 8.979 8.989 - 1.025   Blood, UA trace-lysed (A) negative   pH, UA 5.5 5.0 - 8.0   POC PROTEIN,UA trace negative,  trace   Urobilinogen, UA 0.2 0.2 or 1.0 E.U./dL   Nitrite, UA Negative Negative   Leukocytes, UA Negative Negative  Urine Culture   Collection Time: 06/09/24  2:00 PM   Specimen: Urine   Urine  Result Value Ref Range   Urine Culture, Routine Final report    Organism ID, Bacteria Comment      Physical Exam Constitutional:      Appearance: Normal appearance. He is obese.  HENT:     Head: Normocephalic.  Cardiovascular:     Rate and Rhythm: Normal rate and regular rhythm.  Pulmonary:     Effort: Pulmonary effort is normal.     Breath sounds: Normal breath sounds.  Abdominal:     General: Bowel sounds are normal. There is no distension.     Palpations: Abdomen is soft. There is no mass.     Tenderness: There is no abdominal tenderness. There is no right CVA tenderness, left CVA tenderness, guarding or rebound.     Hernia: No hernia is present.  Neurological:     General: No focal deficit present.     Mental  Status: He is alert and oriented to person, place, and time.  Psychiatric:        Mood and Affect: Mood normal.     .. Diabetic Foot Exam - Simple   Simple Foot Form Diabetic Foot exam was performed with the following findings: Yes 06/09/2024  1:20 PM  Visual Inspection No deformities, no ulcerations, no other skin breakdown bilaterally: Yes Sensation Testing Intact to touch and monofilament testing bilaterally: Yes Pulse Check Posterior Tibialis and Dorsalis pulse intact bilaterally: Yes Comments        Assessment & Plan:  SABRASABRAKienan Doublin was seen today for medical management of chronic issues.  Diagnoses and all orders for this visit:  Frequent urination -     POCT URINALYSIS DIP (CLINITEK) -     Urine Culture  Controlled type 2 diabetes mellitus with diabetic nephropathy, without long-term current use of insulin (HCC) -     POCT HgB A1C -     POCT UA - Microalbumin -     glimepiride  (AMARYL ) 4 MG tablet; Take 1 tablet (4 mg total) by mouth 2 (two)  times daily. -     metFORMIN  (GLUCOPHAGE ) 1000 MG tablet; Take 1 tablet (1,000 mg total) by mouth 2 (two) times daily with a meal.  Essential hypertension -     lisinopril  (ZESTRIL ) 5 MG tablet; Take 1 tablet (5 mg total) by mouth daily.  Dyslipidemia, goal LDL below 70 -     atorvastatin  (LIPITOR) 80 MG tablet; Take 1 tablet (80 mg total) by mouth daily.  S/P primary angioplasty with coronary stent -     atorvastatin  (LIPITOR) 80 MG tablet; Take 1 tablet (80 mg total) by mouth daily.  Microscopic hematuria -     Urinalysis, dipstick only; Future -     Urinalysis, dipstick only  Microalbuminuria    Right now patient has no insurance and declines all extra screenings due to cost A1C to goal Continue gimepiride/metformin  BP to goal, continue lisinopril  Microalbumin abnormal, on ACE On statin Foot exam UTD Needs eye exam Declined all vaccines due to cost  UA with hematuria and patient has urinary frequency Will culture to confirm no infection Repeat UA in 2-3 weeks to confirm blood has resolved Pt declines labs due to cost  Follow up in 6 months for next DM follow up.    Dawn Kiper, PA-C

## 2024-06-11 ENCOUNTER — Ambulatory Visit: Payer: Self-pay | Admitting: Physician Assistant

## 2024-06-11 LAB — URINE CULTURE

## 2024-06-11 NOTE — Progress Notes (Signed)
Normal urine culture

## 2024-06-14 ENCOUNTER — Encounter: Payer: Self-pay | Admitting: Physician Assistant

## 2024-06-14 DIAGNOSIS — R350 Frequency of micturition: Secondary | ICD-10-CM | POA: Insufficient documentation

## 2024-12-07 ENCOUNTER — Ambulatory Visit: Admitting: Physician Assistant
